# Patient Record
Sex: Male | Born: 1960 | Race: Black or African American | Hispanic: No | Marital: Single | Smoking: Former smoker
Health system: Southern US, Community
[De-identification: ages and names within clinical notes are randomized; demographics above are authoritative.]

## PROBLEM LIST (undated history)

## (undated) DIAGNOSIS — E785 Hyperlipidemia, unspecified: Secondary | ICD-10-CM

## (undated) HISTORY — PX: JOINT REPLACEMENT: SHX530

## (undated) HISTORY — PX: MANDIBLE FRACTURE SURGERY: SHX706

---

## 1998-04-10 ENCOUNTER — Emergency Department (HOSPITAL_COMMUNITY): Admission: EM | Admit: 1998-04-10 | Discharge: 1998-04-10 | Payer: Self-pay | Admitting: Emergency Medicine

## 2005-10-16 ENCOUNTER — Inpatient Hospital Stay (HOSPITAL_COMMUNITY): Admission: EM | Admit: 2005-10-16 | Discharge: 2005-10-22 | Payer: Self-pay | Admitting: Emergency Medicine

## 2005-10-16 ENCOUNTER — Encounter (INDEPENDENT_AMBULATORY_CARE_PROVIDER_SITE_OTHER): Payer: Self-pay | Admitting: *Deleted

## 2005-10-17 ENCOUNTER — Ambulatory Visit: Payer: Self-pay | Admitting: Internal Medicine

## 2007-03-12 ENCOUNTER — Emergency Department (HOSPITAL_COMMUNITY): Admission: EM | Admit: 2007-03-12 | Discharge: 2007-03-12 | Payer: Self-pay | Admitting: Emergency Medicine

## 2007-09-03 ENCOUNTER — Emergency Department (HOSPITAL_COMMUNITY): Admission: EM | Admit: 2007-09-03 | Discharge: 2007-09-03 | Payer: Self-pay | Admitting: Family Medicine

## 2008-05-05 ENCOUNTER — Emergency Department (HOSPITAL_COMMUNITY): Admission: EM | Admit: 2008-05-05 | Discharge: 2008-05-05 | Payer: Self-pay | Admitting: Family Medicine

## 2008-11-02 ENCOUNTER — Emergency Department (HOSPITAL_COMMUNITY): Admission: EM | Admit: 2008-11-02 | Discharge: 2008-11-02 | Payer: Self-pay | Admitting: Family Medicine

## 2008-12-12 ENCOUNTER — Emergency Department (HOSPITAL_COMMUNITY): Admission: EM | Admit: 2008-12-12 | Discharge: 2008-12-12 | Payer: Self-pay | Admitting: Family Medicine

## 2009-01-01 ENCOUNTER — Emergency Department (HOSPITAL_COMMUNITY): Admission: EM | Admit: 2009-01-01 | Discharge: 2009-01-01 | Payer: Self-pay | Admitting: Family Medicine

## 2010-09-24 LAB — POCT URINALYSIS DIP (DEVICE)
Hgb urine dipstick: NEGATIVE
Nitrite: NEGATIVE
Protein, ur: NEGATIVE mg/dL
Specific Gravity, Urine: 1.025 (ref 1.005–1.030)
pH: 7 (ref 5.0–8.0)

## 2010-11-01 NOTE — Op Note (Signed)
[name], Henson                ACCOUNT NO.:  [id number]   MEDICAL RECORD NO.:  [id number]          PATIENT TYPE:  INP   LOCATION:  5727                         FACILITY:  [facility]   PHYSICIAN:  Martina Sinner, MD DATE OF BIRTH:  [birth year]   DATE OF PROCEDURE:  10/16/2005  DATE OF DISCHARGE:                                 OPERATIVE REPORT   PREOPERATIVE DIAGNOSIS:  Perineal and scrotal abscess.   POSTOPERATIVE DIAGNOSIS:  Perineal and scrotal abscess, early Fournier's  gangrene.   SURGEON:  Drainage of perineal abscess and debridement of necrotic  subcutaneous tissue and perineal packing; cystoscopy.   Adam Henson, in the last two days, noticed progressive swelling in the  perineum and up into the scrotum.  He had a leg abscess years ago on the  left lower leg.  He did not have any bowel or urinary symptoms.  He had a  little bit of chills yesterday but it was not toxic.   Patient was prepped and draped in the usual fashion.  He was given IV Ancef  and ciprofloxacin prior to the procedure.  During the operation, we repeated  his Flagyl 500 mg.   On physical examination he had a lot of midline perineal swelling as well as  the lower third of the scrotum.  There was no crepitus.  The skin was not  necrotic.  I made a long perineal incision extending it one third into the  lower scrotum.  The soft tissues were very edematous.  There was minimal to  no pus at first.  I broke through soft tissue into the ischial rectal fossa  and there was a little bit of pus.  On three occasions, I took anaerobic and  aerobic cultures.  Using cautery and blunt dissection, I freed up and opened  up the ischial rectal fossa bilaterally and soft tissue high into the  scrotum but I did not open up the tunica vaginalis.  At first there was not  obvious necrotic subcutaneous tissue.  There was a lot of thickening, as  thick as three quarters of an inch of the subcutaneous tissue under each  flap of  skin bilaterally.  I opened this up with cautery and there was no  question there was necrosis.  Using Allis clamp and Bovie, I removed  necrotic tissue circumferentially throughout the entire incision 360  degrees.  All the necrotic soft tissue was removed leaving approximately a  centimeter of vascularized skin and subcutaneous circumferentially.  The  infection and/or inflammation did not dissect as far to the inguinal crease.  He had a lot of swelling in the midline on top or involving the  bulbospongiosus muscle.  I bivalved this in two areas and did not get any  pus.  There was no necrosis.  When I was removing some necrotic tissue at  around 9 o'clock, I inadvertently made a 3 cm incision dimpling the skin.  Later on, I excised some skin and closed the incision with interrupted 3-0  chromic suture.  At a few locations, there was approximately 0.5 cm of  subcutaneous tissue underneath the well-vascularized skin.  I do not think  he will have any skin necrosis postoperatively.  My goal is to remove as  much palpable and visualized edematous necrotic tissue and leave him with  supple, pliable vascularized tissue.  This was accomplished.   Use the Pulse-Evac, approximately 5 liters of fluid was utilized.  I then  used 500 mL of fluid with bacitracin and polymyxin antibiotics.   Hemostasis was obtained easy with cautery throughout the case.  Total blood  loss was approximately 150 mL.   I did cystoscope the patient early on to make certain he did not have  urethral stricture.  The penile bulbar membranous and prostatic urethra were  normal.  The bladder mucosa was normal.   I did a digital rectal examination.  There was no pus or blood in the  rectum.  With my finger in the ischial rectal fossa bilaterally, I could  feel the rectal wall and I could not feel any induration or abscess.  I did  not feel that a general surgery consultation was required.   I was very pleased with the  removal of any early necrotic tissue.  In my  opinion, this represented an early Fournier's gangrene.  The skin definitely  was not necrotic and looked healthy.  A large wet gauze pack was inserted  followed by a dry dressing with abdominal pad and mesh pants.   The patient will be put on gentamicin and Zosyn postoperatively as per  pharmacy.  I spoke to pharmacy and they know that his serum creatinine was  1.4.  I will repeat his basic metabolic panel and CBC in the morning.           ______________________________  Martina Sinner, MD  Electronically Signed     SAM/MEDQ  D:  10/16/2005  T:  10/17/2005  Job:  [id number]

## 2010-11-01 NOTE — Discharge Summary (Signed)
Adam Henson, Adam Henson                ACCOUNT NO.:  [id number]   MEDICAL RECORD NO.:  [id number]          PATIENT TYPE:  INP   LOCATION:  5742                         FACILITY:  [facility]   PHYSICIAN:  Martina Sinner, MD DATE OF BIRTH:  May 29, 1961   DATE OF ADMISSION:  10/16/2005  DATE OF DISCHARGE:  10/21/2005                                 DISCHARGE SUMMARY   ADMISSION DIAGNOSIS:  Perineal abscess with possible early Fournier's  gangrene.   DISCHARGE DIAGNOSIS:  Community-acquired Staph aureus perineal infection.   SURGERY:  1.  Drainage and debridement of perineal abscess and necrotic tissue.  2.  Cystoscopy.   HOSPITAL COURSE:  Adam Henson was admitted with a 2-day swelling in the  perineum and scrotum.  The surgical note is dictated.  He had debridement  similar to treatment of a Fournier's gangrene.  His cultures came back as  methicillin-resistant Staph aureus.  Infectious Disease saw the patient and  switched his antibiotics to vancomycin.  Initially, he was on Zosyn,  gentamicin and vancomycin.   Adam Henson initially had an elevated white blood count, which normalized on  antibiotics.  He was not febrile.  He did not become toxic.   In the hospital, he had wet-to-dry dressings once a day, which was switched  to twice a day over the weekend.  His wound was granulating in very well.  A  lot of his scrotal cellulitis was normalizing.   DISCHARGE MEDICATIONS:  1.  He was discharged home after a week of IV antibiotics on oral Septra for      3 weeks, one tablet twice a day.  2.  He was given Bactrim DS (double-strength); this was recommended by Dr.      Orvan Falconer in Infectious Disease.   FOLLOWUP:  Adam Henson will follow up in my clinic next week for a wound  check.  Our nurses will call and make certain he is doing well.  He is going  to have daily dressing changes by home health.   CONDITION ON DISCHARGE:  He was voiding well, catheter-free, on the day of   discharge.           ______________________________  Martina Sinner, MD  Electronically Signed     SAM/MEDQ  D:  10/22/2005  T:  10/23/2005  Job:  [id number]

## 2010-11-01 NOTE — H&P (Signed)
Adam Henson, Adam Henson                ACCOUNT NO.:  [id number]   MEDICAL RECORD NO.:  [id number]          PATIENT TYPE:  INP   LOCATION:  5727                         FACILITY:  [facility]   PHYSICIAN:  Martina Sinner, MD DATE OF BIRTH:  [birth year]   DATE OF ADMISSION:  10/16/2005  DATE OF DISCHARGE:                                HISTORY & PHYSICAL   ADMISSION DIAGNOSIS:  Perineal and scrotal abscess with early Fournier's  gangrene (admission postoperatively).   Mr. [name] has a 48-hour history of increasing perineal and scrotal  swelling.  He had no urinary symptoms.  He had no rectal symptoms.  He had  this skin abscess years ago on his left leg drained.  He had no trauma to  the area.  He is not a diabetic.  He had a few chills yesterday but actually  felt quite well when I saw him.  He was having perineal pain.   PAST MEDICAL HISTORY:  No previous surgery.  Negative past medical history.   MEDICATIONS:  None.   ALLERGIES:  None.   SOCIAL HISTORY:  Lives in [place].   FAMILY HISTORY:  No history of prostate cancer.   REVIEW OF SYSTEMS:  The rest of the review of systems is negative.   PHYSICAL EXAMINATION:  VITALS:  Temperature 99.1, blood pressure 120/64.  GENERAL APPEARANCE:  No distress.  SKIN:  Well-healed skin ulcer, left lower leg.  ABDOMEN:  No masses.  RESPIRATORY:  Breathing quietly.  CARDIOVASCULAR:  Extremities warm.  NEUROVASCULAR:  Normal sensation.  MUSCULOSKELETAL:  Normal leg strength.  LYMPHATICS:  No palpable inguinal nodes.  GU:  Perineal swelling.  Bilateral scrotal swelling.  No crepitus or  cellulitis.  No obvious pus or lymph nodes.  Penile shaft normal.  Perirectal area normal.   LABS:  Hemoglobin 13.9, white blood count 17.9.  Urinalysis negative except  for rare bacteria.  Glucose 120, creatinine 1.4.   Mr. Bessey underwent surgery, in which case I opened up the perineum and  debrided early subcutaneous necrotic tissue.  There was a  minimal amount of  pus found and drained.  Copious irrigation was utilized.  The surgical  report has been dictated.  The patient will be admitted for IV antibiotics,  including Zosyn and gentamicin.           ______________________________  Martina Sinner, MD  Electronically Signed     SAM/MEDQ  D:  10/16/2005  T:  10/16/2005  Job:  [id number]

## 2013-03-15 ENCOUNTER — Encounter (HOSPITAL_COMMUNITY): Payer: Self-pay | Admitting: Emergency Medicine

## 2013-03-15 ENCOUNTER — Emergency Department (HOSPITAL_COMMUNITY)
Admission: EM | Admit: 2013-03-15 | Discharge: 2013-03-15 | Disposition: A | Discharge status: Home / Self Care | Payer: BC Managed Care – PPO | Source: Home / Self Care | Attending: Emergency Medicine | Admitting: Emergency Medicine

## 2013-03-15 DIAGNOSIS — S39012A Strain of muscle, fascia and tendon of lower back, initial encounter: Secondary | ICD-10-CM

## 2013-03-15 DIAGNOSIS — S335XXA Sprain of ligaments of lumbar spine, initial encounter: Secondary | ICD-10-CM

## 2013-03-15 MED ORDER — TRAMADOL HCL 50 MG PO TABS: 100.0000 mg | ORAL_TABLET | Freq: Three times a day (TID) | ORAL | Status: DC | PRN

## 2013-03-15 MED ORDER — MELOXICAM 15 MG PO TABS: 15.0000 mg | ORAL_TABLET | Freq: Every day | ORAL | Status: DC

## 2013-03-15 MED ORDER — METHOCARBAMOL 500 MG PO TABS: 500.0000 mg | ORAL_TABLET | Freq: Three times a day (TID) | ORAL | Status: DC

## 2013-03-15 NOTE — ED Notes (Signed)
Pt c/o lower back pain onset 1 week Denies inj/trauma, urinary sxs Reports he has been lifting heavy bags of 55 lbs at work and lifting weights  Taking ibup w/temp relief and applying ice/heat He is alert w/no signs of acute distress.

## 2013-03-16 NOTE — ED Provider Notes (Signed)
Chief Complaint:   Chief Complaint  Patient presents with  . Back Pain    History of Present Illness:   Adam Henson is a 52 year old male who strained his back about a week ago. He was lifting weights and also lifting some heavy things at work. The pain is worse if he moves and better with ibuprofen. It's localized to the lower back without radiation. He denies any numbness, tingling, or weakness in lower extremities. There no bladder or bowel complaints. He denies any abdominal pain, fever, chills, headache, stiff neck, or unintended weight loss. He had a similar back problem about a year ago which went away with conservative treatment.  Review of Systems:  Other than noted above, the patient denies any of the following symptoms: Systemic:  No fever, chills, severe fatigue, or unexplained weight loss. GI:  No abdominal pain, nausea, vomiting, diarrhea, constipation, incontinence of bowel, or blood in stool. GU:  No dysuria, frequency, urgency, or hematuria. No incontinence of urine or difficulty urinating.  M-S:  No neck pain, joint pain, arthritis, or myalgias. Neuro:  No paresthesias, saddle anesthesia, muscular weakness, or progressive neurological deficit.  PMFSH:  Past medical history, family history, social history, meds, and allergies were reviewed. Specifically, there is no history of cancer, major trauma, osteoporosis, immunosuppression, or HIV infection.  Physical Exam:   Vital signs:  BP 146/90  Pulse 58  Temp(Src) 98.9 F (37.2 C) (Oral)  Resp 18  SpO2 99% General:  Alert, oriented, in no distress. Abdomen:  Soft, non-tender.  No organomegaly or mass.  No pulsatile midline abdominal mass or bruit. Back:  There is tenderness to palpation in the paravertebral muscles in the midline. The back has a limited range of motion with pain. Straight leg raising is negative. Neuro:  Normal muscle strength, sensations and DTRs. Extremities: Pedal pulses were full, there was no  edema. Skin:  Clear, warm and dry.  No rash.  Assessment:  The encounter diagnosis was Lumbar strain, initial encounter.  Plan:   1.  Meds:  The following meds were prescribed:   Discharge Medication List as of 03/15/2013  5:54 PM    START taking these medications   Details  meloxicam (MOBIC) 15 MG tablet Take 1 tablet (15 mg total) by mouth daily., Starting 03/15/2013, Until Discontinued, Normal    methocarbamol (ROBAXIN) 500 MG tablet Take 1 tablet (500 mg total) by mouth 3 (three) times daily., Starting 03/15/2013, Until Discontinued, Normal    traMADol (ULTRAM) 50 MG tablet Take 2 tablets (100 mg total) by mouth every 8 (eight) hours as needed for pain., Starting 03/15/2013, Until Discontinued, Normal        2.  Patient Education/Counseling:  The patient was given appropriate handouts, self care instructions, and instructed in symptomatic relief. The patient was encouraged to try to be as active as possible and given some exercises to do followed by moist heat.  3.  Follow up:  The patient was told to follow up if no better in 3 to 4 days, if becoming worse in any way, and given some red flag symptoms such as worsening pain or new neurological symptoms which would prompt immediate return.  Follow up here if needed.     Reuben Likes, MD 03/16/13 [phone]

## 2013-09-07 ENCOUNTER — Ambulatory Visit (INDEPENDENT_AMBULATORY_CARE_PROVIDER_SITE_OTHER): Payer: Self-pay

## 2013-09-07 ENCOUNTER — Other Ambulatory Visit: Payer: Self-pay | Admitting: Emergency Medicine

## 2013-09-07 DIAGNOSIS — Z0000 Encounter for general adult medical examination without abnormal findings: Secondary | ICD-10-CM

## 2013-09-07 LAB — HEPATIC FUNCTION PANEL
ALT: 17 U/L (ref 10–40)
AST: 22 U/L (ref 14–40)
Alkaline Phosphatase: 58 U/L (ref 25–125)

## 2013-09-07 LAB — LIPID PANEL
Cholesterol: 228 mg/dL — AB (ref 0–200)
HDL: 46 mg/dL (ref 35–70)
LDL CALC: 169 mg/dL
TRIGLYCERIDES: 65 mg/dL (ref 40–160)

## 2013-09-07 LAB — PSA: PSA: 0.5

## 2013-09-07 LAB — CBC AND DIFFERENTIAL
HEMATOCRIT: 48 % (ref 41–53)
Hemoglobin: 15.2 g/dL (ref 13.5–17.5)

## 2013-09-07 LAB — BASIC METABOLIC PANEL
Creatinine: 1.5 mg/dL — AB (ref 0.6–1.3)
GLUCOSE: 90 mg/dL

## 2013-09-23 ENCOUNTER — Ambulatory Visit (INDEPENDENT_AMBULATORY_CARE_PROVIDER_SITE_OTHER): Payer: BC Managed Care – PPO | Admitting: Family Medicine

## 2013-09-23 ENCOUNTER — Encounter: Payer: Self-pay | Admitting: Family Medicine

## 2013-09-23 VITALS — BP 134/81 | HR 77 | Ht 71.0 in | Wt 237.0 lb

## 2013-09-23 DIAGNOSIS — E785 Hyperlipidemia, unspecified: Secondary | ICD-10-CM

## 2013-09-23 DIAGNOSIS — Z131 Encounter for screening for diabetes mellitus: Secondary | ICD-10-CM

## 2013-09-23 DIAGNOSIS — Z283 Underimmunization status: Secondary | ICD-10-CM

## 2013-09-23 DIAGNOSIS — IMO0001 Reserved for inherently not codable concepts without codable children: Secondary | ICD-10-CM | POA: Insufficient documentation

## 2013-09-23 DIAGNOSIS — Z23 Encounter for immunization: Secondary | ICD-10-CM

## 2013-09-23 DIAGNOSIS — R030 Elevated blood-pressure reading, without diagnosis of hypertension: Secondary | ICD-10-CM

## 2013-09-23 DIAGNOSIS — Z2839 Other underimmunization status: Secondary | ICD-10-CM

## 2013-09-23 NOTE — Patient Instructions (Signed)
DASH Diet  The DASH diet stands for "Dietary Approaches to Stop Hypertension." It is a healthy eating plan that has been shown to reduce high blood pressure (hypertension) in as little as 14 days, while also possibly providing other significant health benefits. These other health benefits include reducing the risk of breast cancer after menopause and reducing the risk of type 2 diabetes, heart disease, colon cancer, and stroke. Health benefits also include weight loss and slowing kidney failure in patients with chronic kidney disease.   DIET GUIDELINES  · Limit salt (sodium). Your diet should contain less than 1500 mg of sodium daily.  · Limit refined or processed carbohydrates. Your diet should include mostly whole grains. Desserts and added sugars should be used sparingly.  · Include small amounts of heart-healthy fats. These types of fats include nuts, oils, and tub margarine. Limit saturated and trans fats. These fats have been shown to be harmful in the body.  CHOOSING FOODS   The following food groups are based on a 2000 calorie diet. See your Registered Dietitian for individual calorie needs.  Grains and Grain Products (6 to 8 servings daily)  · Eat More Often: Whole-wheat bread, brown rice, whole-grain or wheat pasta, quinoa, popcorn without added fat or salt (air popped).  · Eat Less Often: White bread, white pasta, white rice, cornbread.  Vegetables (4 to 5 servings daily)  · Eat More Often: Fresh, frozen, and canned vegetables. Vegetables may be raw, steamed, roasted, or grilled with a minimal amount of fat.  · Eat Less Often/Avoid: Creamed or fried vegetables. Vegetables in a cheese sauce.  Fruit (4 to 5 servings daily)  · Eat More Often: All fresh, canned (in natural juice), or frozen fruits. Dried fruits without added sugar. One hundred percent fruit juice (½ cup [237 mL] daily).  · Eat Less Often: Dried fruits with added sugar. Canned fruit in light or heavy syrup.  Lean Meats, Fish, and Poultry (2  servings or less daily. One serving is 3 to 4 oz [85-114 g]).  · Eat More Often: Ninety percent or leaner ground beef, tenderloin, sirloin. Round cuts of beef, chicken breast, turkey breast. All fish. Grill, bake, or broil your meat. Nothing should be fried.  · Eat Less Often/Avoid: Fatty cuts of meat, turkey, or chicken leg, thigh, or wing. Fried cuts of meat or fish.  Dairy (2 to 3 servings)  · Eat More Often: Low-fat or fat-free milk, low-fat plain or light yogurt, reduced-fat or part-skim cheese.  · Eat Less Often/Avoid: Milk (whole, 2%). Whole milk yogurt. Full-fat cheeses.  Nuts, Seeds, and Legumes (4 to 5 servings per week)  · Eat More Often: All without added salt.  · Eat Less Often/Avoid: Salted nuts and seeds, canned beans with added salt.  Fats and Sweets (limited)  · Eat More Often: Vegetable oils, tub margarines without trans fats, sugar-free gelatin. Mayonnaise and salad dressings.  · Eat Less Often/Avoid: Coconut oils, palm oils, butter, stick margarine, cream, half and half, cookies, candy, pie.  FOR MORE INFORMATION  The Dash Diet Eating Plan: www.dashdiet.org  Document Released: 05/22/2011 Document Revised: 08/25/2011 Document Reviewed: 05/22/2011  [employer]® Patient Information ©2014 [employer].

## 2013-09-23 NOTE — Progress Notes (Signed)
CC: Adam Henson is a 53 y.o. male is here for Establish Care   Subjective: HPI:  Pleasant 53 year old here to establish care  He was sent here after cholesterol is noticed to be elevated when he was getting a complete physical exam for his employer. Review of his labs show HDL cholesterol within normal limits, LDL cholesterol 169, triglycerides normal. He tries to stay active but no formal exercise routine. He admits to frequently eating fast foods and does not watch his diet.  He denies any history of heart disease, vascular disease, nor diabetes. No interventions as of yet for lowering cholesterol. Has never been on cholesterol lowering medications  Review of blood work shows that fasting blood sugar was less than 100.  He also had a PSA drawn that was 0.5.   Review of Systems - General ROS: negative for - chills, fever, night sweats, weight gain or weight loss Ophthalmic ROS: negative for - decreased vision Psychological ROS: negative for - anxiety or depression ENT ROS: negative for - hearing change, nasal congestion, tinnitus or allergies Hematological and Lymphatic ROS: negative for - bleeding problems, bruising or swollen lymph nodes Breast ROS: negative Respiratory ROS: no cough, shortness of breath, or wheezing Cardiovascular ROS: no chest pain or dyspnea on exertion Gastrointestinal ROS: no abdominal pain, change in bowel habits, or black or bloody stools Genito-Urinary ROS: negative for - genital discharge, genital ulcers, incontinence or abnormal bleeding from genitals Musculoskeletal ROS: negative for - joint pain or muscle pain Neurological ROS: negative for - headaches or memory loss Dermatological ROS: negative for lumps, mole changes, rash and skin lesion changes  History reviewed. No pertinent past medical history.  History reviewed. No pertinent past surgical history. Family History  Problem Relation Age of Onset  . Lung cancer Father     deceased  . Breast  cancer Mother     History   Social History  . Marital Status: Single    Spouse Name: N/A    Number of Children: N/A  . Years of Education: N/A   Occupational History  . Not on file.   Social History Main Topics  . Smoking status: Former Smoker    Quit date: 09/24/1987  . Smokeless tobacco: Not on file  . Alcohol Use: No  . Drug Use: No  . Sexual Activity: Yes    Partners: Male   Other Topics Concern  . Not on file   Social History Narrative  . No narrative on file     Objective: BP 134/81  Pulse 77  Ht 5\' 11"  (1.803 m)  Wt 237 lb (107.502 kg)  BMI 33.07 kg/m2  General: Alert and Oriented, No Acute Distress HEENT: Pupils equal, round, reactive to light. Conjunctivae clear.   Lungs: Clear to auscultation bilaterally, no wheezing/ronchi/rales.  Comfortable work of breathing. Good air movement. Cardiac: Regular rate and rhythm. Normal S1/S2.  No murmurs, rubs, nor gallops.   Extremities: No peripheral edema.  Strong peripheral pulses.  Mental Status: No depression, anxiety, nor agitation. Skin: Warm and dry.  Assessment & Plan: Nhat was seen today for establish care.  Diagnoses and associated orders for this visit:  Diabetes mellitus screening  Elevated blood pressure  Hyperlipidemia  Immunization deficiency - Tdap vaccine greater than or equal to 7yo IM    Diabetes screening done outside our office is normal Hyperlipidemia: Controlled we discussed diet and exercise interventions that he is optimistic about being able to adhere to, physical activity most days of the week  along with following a DASH diet followup in 3 months for repeat cholesterol Elevated blood pressure: Blood pressure here and in occupational health was in pre- hypertensive range, stressed the importance of limiting sodium and benefits of exercise He believes it has been well he on 10 years since tetanus booster will receive booster today  Return in about 3 months (around  12/23/2013).

## 2013-09-26 ENCOUNTER — Encounter: Payer: Self-pay | Admitting: *Deleted

## 2013-09-26 ENCOUNTER — Ambulatory Visit (INDEPENDENT_AMBULATORY_CARE_PROVIDER_SITE_OTHER): Payer: BC Managed Care – PPO | Admitting: Family Medicine

## 2013-09-26 VITALS — BP 142/86 | HR 60 | Temp 98.0°F | Resp 18 | Ht 71.0 in | Wt 232.0 lb

## 2013-09-26 DIAGNOSIS — R21 Rash and other nonspecific skin eruption: Secondary | ICD-10-CM

## 2013-09-26 MED ORDER — PREDNISONE 20 MG PO TABS: ORAL_TABLET | ORAL | Status: DC

## 2013-09-26 NOTE — Progress Notes (Signed)
[facility] [address], [place] [id number] [phone]  Date:  09/26/2013   Name:  Adam MylarRodner C Henson   DOB:  1961-05-18   MRN:  [id number]  PCP:  Laren BoomHommel, Sean, DO    Chief Complaint: Allergic Reaction   History of Present Illness:  Adam Henson is a 53 y.o. very pleasant male patient who presents with the following:  Here today with a possible allergy which has resulted in a rash on his face.   He is wearing a dust mask at work- he thinks he has been using the same kind for many years.  He does not think it is a different sort or brand,  but it seems to smell a little different to him.  He mask has a rubber seal around the face.    He has noted an itchy break out on his face for about one week.  It was actually worse than it is now- he scrubbed "the scabs" off and then used some gold bond lotion which seemed to help some He does not have any hives, systemic itching, throat or tongue swelling, SOB or wheezing.    Patient Active Problem List   Diagnosis Date Noted  . Hyperlipidemia 09/23/2013  . Elevated blood pressure 09/23/2013    History reviewed. No pertinent past medical history.  Past Surgical History  Procedure Laterality Date  . Mandible fracture surgery    . Joint replacement      History  Substance Use Topics  . Smoking status: Former Smoker    Quit date: 09/24/1987  . Smokeless tobacco: Not on file  . Alcohol Use: No    Family History  Problem Relation Age of Onset  . Lung cancer Father     deceased  . Cancer Father   . Breast cancer Mother   . Cancer Mother     No Known Allergies  Medication list has been reviewed and updated.  Current Outpatient Prescriptions on File Prior to Visit  Medication Sig Dispense Refill  . Multiple Vitamins-Minerals (CENTRUM SILVER PO) Take by mouth.       No current facility-administered medications on file prior to visit.    Review of Systems:  As per HPI- otherwise  negative.   Physical Examination: Filed Vitals:   09/26/13 1323  BP: 142/86  Pulse: 60  Temp: 98 F (36.7 C)  Resp: 18   Filed Vitals:   09/26/13 1323  Height: 5\' 11"  (1.803 m)  Weight: 232 lb (105.235 kg)   Body mass index is 32.37 kg/(m^2). Ideal Body Weight: Weight in (lb) to have BMI = 25: 178.9  GEN: WDWN, NAD, Non-toxic, A & O x 3, large build, looks well HEENT: Atraumatic, Normocephalic. Neck supple. No masses, No LAD.  Bilateral TM wnl, oropharynx normal.  PEERL,EOMI.   He has an irritated mild rash in both nasolabial folds.  It is slightly red, no hives or vesicles.  No angioedema Ears and Nose: No external deformity. CV: RRR, No M/G/R. No JVD. No thrill. No extra heart sounds. PULM: CTA B, no wheezes, crackles, rhonchi. No retractions. No resp. distress. No accessory muscle use. EXTR: No c/c/e NEURO Normal gait.  PSYCH: Normally interactive. Conversant. Not depressed or anxious appearing.  Calm demeanor.    Assessment and Plan: Rash and nonspecific skin eruption - Plan: predniSONE (DELTASONE) 20 MG tablet  Skin irritation.  Will treat with systemic steroids for a short period as prefer to avoid topical steroids on his  face.  Suggested that he avoid using the rubber lined masks in the future as he may be allergic to these.  If any sign of a systemic allergic reaction he should take benadryl and call 911  Signed Abbe Amsterdam, MD

## 2013-09-26 NOTE — Patient Instructions (Signed)
Use the prednisone as directed for your rash and itching. Let me know if you are not better in the next few days- Sooner if worse.   Stay away from the rubber lined dust mask

## 2013-12-23 ENCOUNTER — Ambulatory Visit: Payer: BC Managed Care – PPO | Admitting: Family Medicine

## 2014-01-20 ENCOUNTER — Ambulatory Visit: Payer: BC Managed Care – PPO | Admitting: Family Medicine

## 2014-01-20 DIAGNOSIS — Z0289 Encounter for other administrative examinations: Secondary | ICD-10-CM

## 2016-04-15 ENCOUNTER — Other Ambulatory Visit: Payer: Self-pay | Admitting: Occupational Medicine

## 2016-04-15 ENCOUNTER — Ambulatory Visit: Payer: Self-pay

## 2016-04-15 DIAGNOSIS — Z0000 Encounter for general adult medical examination without abnormal findings: Secondary | ICD-10-CM

## 2016-12-20 ENCOUNTER — Encounter (HOSPITAL_COMMUNITY): Payer: Self-pay | Admitting: Emergency Medicine

## 2016-12-20 ENCOUNTER — Ambulatory Visit (HOSPITAL_COMMUNITY)
Admission: EM | Admit: 2016-12-20 | Discharge: 2016-12-20 | Disposition: A | Discharge status: Home / Self Care | Payer: BLUE CROSS/BLUE SHIELD | Attending: Family Medicine | Admitting: Family Medicine

## 2016-12-20 DIAGNOSIS — L03116 Cellulitis of left lower limb: Secondary | ICD-10-CM

## 2016-12-20 MED ORDER — SULFAMETHOXAZOLE-TRIMETHOPRIM 800-160 MG PO TABS: 1.0000 | ORAL_TABLET | Freq: Two times a day (BID) | ORAL | 0 refills | Status: AC

## 2016-12-20 NOTE — Discharge Instructions (Signed)
Today you were diagnosed with the following: 1. Cellulitis of left upper extremity    You have been prescribed prescription medications this visit.   Meds ordered this encounter  Medications   sulfamethoxazole-trimethoprim (BACTRIM DS,SEPTRA DS) 800-160 MG tablet    Sig: Take 1 tablet by mouth 2 (two) times daily.    Dispense:  20 tablet    Refill:  0   Please take all medications as directed. If not improving within the next 48-72 hours please return for an evaluation.  Your blood pressure was noted to be elevated during your visit today. You may return here within the next few days or when you are well to recheck if unable to see your primary care doctor.

## 2016-12-20 NOTE — ED Triage Notes (Signed)
States abscess is located at left upper thigh.  Patient has a history of the same

## 2016-12-20 NOTE — ED Provider Notes (Signed)
  [facility] CARE CENTER   [id number] 12/20/16 Arrival Time: 1553  ASSESSMENT & PLAN:  Today you were diagnosed with the following: 1. Cellulitis of left lower extremity    You have been prescribed prescription medications this visit.   Meds ordered this encounter  Medications  . sulfamethoxazole-trimethoprim (BACTRIM DS,SEPTRA DS) 800-160 MG tablet    Sig: Take 1 tablet by mouth 2 (two) times daily.    Dispense:  20 tablet    Refill:  0   Please take all medications as directed. If not improving within the next 48-72 hours please return for an evaluation.  Your blood pressure was noted to be elevated during your visit today. You may return here within the next few days or when you are well to recheck if unable to see your primary care doctor.   Reviewed expectations re: course of current medical issues. Questions answered. Outlined signs and symptoms indicating need for more acute intervention. Patient verbalized understanding. After Visit Summary given.   SUBJECTIVE:  Adam Henson is a 56 y.o. male who presents with complaint of skin infection on LL thigh. Several days. Increasing erythema. Tenderness. Afebrile. No drainage. No self treatment. Distant h/o similar. Ambulatory.  ROS: As per HPI.   OBJECTIVE:  Vitals:   12/20/16 1637  BP: 140/90  Pulse: 68  Temp: 98.5 F (36.9 C)  TempSrc: Oral  SpO2: 100%     General appearance: alert; no distress Extremities: left upper anterior thigh with 3cm area of erythema and thickened skin; some tenderness; no fluctuance MSK: symmetrical with no gross deformities   No Known Allergies  PMHx, SurgHx, SocialHx, Medications, and Allergies were reviewed in the Visit Navigator and updated as appropriate.      Mardella Layman, MD 12/21/16 [phone]

## 2018-07-14 ENCOUNTER — Ambulatory Visit: Payer: Self-pay | Admitting: Physician Assistant

## 2020-12-17 ENCOUNTER — Other Ambulatory Visit: Payer: Self-pay

## 2020-12-17 ENCOUNTER — Encounter (HOSPITAL_COMMUNITY): Payer: Self-pay | Admitting: *Deleted

## 2020-12-17 ENCOUNTER — Ambulatory Visit (HOSPITAL_COMMUNITY)
Admission: EM | Admit: 2020-12-17 | Discharge: 2020-12-17 | Disposition: A | Discharge status: Home / Self Care | Payer: BC Managed Care – PPO | Attending: Urgent Care | Admitting: Urgent Care

## 2020-12-17 DIAGNOSIS — M546 Pain in thoracic spine: Secondary | ICD-10-CM | POA: Diagnosis not present

## 2020-12-17 DIAGNOSIS — M62830 Muscle spasm of back: Secondary | ICD-10-CM | POA: Diagnosis not present

## 2020-12-17 DIAGNOSIS — S29012A Strain of muscle and tendon of back wall of thorax, initial encounter: Secondary | ICD-10-CM

## 2020-12-17 LAB — POCT URINALYSIS DIPSTICK, ED / UC
Bilirubin Urine: NEGATIVE
Glucose, UA: NEGATIVE mg/dL
Ketones, ur: NEGATIVE mg/dL
Leukocytes,Ua: NEGATIVE
Nitrite: NEGATIVE
Protein, ur: NEGATIVE mg/dL
Specific Gravity, Urine: 1.025 (ref 1.005–1.030)
Urobilinogen, UA: 1 mg/dL (ref 0.0–1.0)
pH: 6 (ref 5.0–8.0)

## 2020-12-17 MED ORDER — TIZANIDINE HCL 4 MG PO TABS: 4.0000 mg | ORAL_TABLET | Freq: Three times a day (TID) | ORAL | 0 refills | Status: DC | PRN

## 2020-12-17 MED ORDER — NAPROXEN 500 MG PO TABS: 500.0000 mg | ORAL_TABLET | Freq: Two times a day (BID) | ORAL | 0 refills | Status: DC

## 2020-12-17 NOTE — ED Provider Notes (Signed)
[facility] - URGENT CARE CENTER   MRN: [id number] DOB: 1961/06/13  Subjective:   Adam Henson is a 60 y.o. male presenting for 4-day history of acute onset progressively worsening 10 out of 10 thoracic back pain on the right side.  Denies fever, nausea, vomiting, hematuria, dysuria, history of kidney stones, rashes, falls, trauma.  Patient works a very strenuous job, does a lot of fast-paced work, intermittent lifting.  Tries to stay hydrated.  Denies taking chronic medications.  No Known Allergies  No past medical history on file.   Past Surgical History:  Procedure Laterality Date   JOINT REPLACEMENT     MANDIBLE FRACTURE SURGERY      Family History  Problem Relation Age of Onset   Lung cancer Father        deceased   Cancer Father    Breast cancer Mother    Cancer Mother     Social History   Tobacco Use   Smoking status: Former    Pack years: 0.00    Types: Cigarettes    Quit date: 09/24/1987    Years since quitting: 33.2   Smokeless tobacco: Never  Substance Use Topics   Alcohol use: No   Drug use: No    ROS   Objective:   Vitals: BP 130/77   Pulse 63   Temp 98.4 F (36.9 C)   Resp 18   SpO2 100%   Physical Exam Constitutional:      General: He is not in acute distress.    Appearance: Normal appearance. He is well-developed and normal weight. He is not ill-appearing, toxic-appearing or diaphoretic.  HENT:     Head: Normocephalic and atraumatic.     Right Ear: External ear normal.     Left Ear: External ear normal.     Nose: Nose normal.     Mouth/Throat:     Pharynx: Oropharynx is clear.  Eyes:     General: No scleral icterus.       Right eye: No discharge.        Left eye: No discharge.     Extraocular Movements: Extraocular movements intact.     Pupils: Pupils are equal, round, and reactive to light.  Cardiovascular:     Rate and Rhythm: Normal rate.  Pulmonary:     Effort: Pulmonary effort is normal.  Musculoskeletal:      Cervical back: Normal range of motion.     Thoracic back: Spasms and tenderness present. No swelling, edema, deformity, signs of trauma, lacerations or bony tenderness. Normal range of motion. No scoliosis.     Lumbar back: No swelling, edema, deformity, signs of trauma, lacerations, spasms, tenderness or bony tenderness. Normal range of motion. Negative right straight leg raise test and negative left straight leg raise test. No scoliosis.       Back:     Comments: No midline tenderness.  Skin:    Findings: No rash.  Neurological:     Mental Status: He is alert and oriented to person, place, and time.     Motor: No weakness.     Coordination: Coordination normal.     Gait: Gait normal.     Deep Tendon Reflexes: Reflexes normal.  Psychiatric:        Mood and Affect: Mood normal.        Behavior: Behavior normal.        Thought Content: Thought content normal.        Judgment: Judgment normal.  Results for orders placed or performed during the hospital encounter of 12/17/20 (from the past 24 hour(s))  POC Urinalysis dipstick     Status: Abnormal   Collection Time: 12/17/20  9:49 AM  Result Value Ref Range   Glucose, UA NEGATIVE NEGATIVE mg/dL   Bilirubin Urine NEGATIVE NEGATIVE   Ketones, ur NEGATIVE NEGATIVE mg/dL   Specific Gravity, Urine 1.025 1.005 - 1.030   Hgb urine dipstick TRACE (A) NEGATIVE   pH 6.0 5.0 - 8.0   Protein, ur NEGATIVE NEGATIVE mg/dL   Urobilinogen, UA 1.0 0.0 - 1.0 mg/dL   Nitrite NEGATIVE NEGATIVE   Leukocytes,Ua NEGATIVE NEGATIVE    Assessment and Plan :   PDMP not reviewed this encounter.  1. Acute right-sided thoracic back pain   2. Muscle spasm of back   3. Strain of thoracic back region     Will manage conservatively for back strain with NSAID and muscle relaxant, rest and modification of physical activity.  Anticipatory guidance provided.  Counseled patient on potential for adverse effects with medications prescribed/recommended today, ER  and return-to-clinic precautions discussed, patient verbalized understanding.    Wallis Bamberg, PA-C 12/17/20 1010

## 2020-12-17 NOTE — ED Triage Notes (Signed)
Pt reports back pain starting on Thursday.

## 2021-11-28 ENCOUNTER — Encounter (HOSPITAL_COMMUNITY): Payer: Self-pay

## 2021-11-28 ENCOUNTER — Emergency Department (HOSPITAL_COMMUNITY): Payer: BC Managed Care – PPO

## 2021-11-28 ENCOUNTER — Inpatient Hospital Stay (HOSPITAL_COMMUNITY)
Admission: EM | Admit: 2021-11-28 | Discharge: 2021-12-08 | DRG: 286 | Disposition: A | Discharge status: Home / Self Care | Payer: BC Managed Care – PPO | Attending: Family Medicine | Admitting: Family Medicine

## 2021-11-28 ENCOUNTER — Other Ambulatory Visit: Payer: Self-pay

## 2021-11-28 DIAGNOSIS — M7989 Other specified soft tissue disorders: Secondary | ICD-10-CM | POA: Diagnosis not present

## 2021-11-28 DIAGNOSIS — Z87891 Personal history of nicotine dependence: Secondary | ICD-10-CM | POA: Diagnosis not present

## 2021-11-28 DIAGNOSIS — E669 Obesity, unspecified: Secondary | ICD-10-CM | POA: Diagnosis present

## 2021-11-28 DIAGNOSIS — N1831 Chronic kidney disease, stage 3a: Secondary | ICD-10-CM | POA: Diagnosis present

## 2021-11-28 DIAGNOSIS — R7989 Other specified abnormal findings of blood chemistry: Secondary | ICD-10-CM | POA: Diagnosis not present

## 2021-11-28 DIAGNOSIS — Z83438 Family history of other disorder of lipoprotein metabolism and other lipidemia: Secondary | ICD-10-CM

## 2021-11-28 DIAGNOSIS — E785 Hyperlipidemia, unspecified: Secondary | ICD-10-CM | POA: Diagnosis present

## 2021-11-28 DIAGNOSIS — R570 Cardiogenic shock: Secondary | ICD-10-CM | POA: Diagnosis not present

## 2021-11-28 DIAGNOSIS — Z801 Family history of malignant neoplasm of trachea, bronchus and lung: Secondary | ICD-10-CM

## 2021-11-28 DIAGNOSIS — R739 Hyperglycemia, unspecified: Secondary | ICD-10-CM | POA: Diagnosis not present

## 2021-11-28 DIAGNOSIS — Z6831 Body mass index (BMI) 31.0-31.9, adult: Secondary | ICD-10-CM | POA: Diagnosis not present

## 2021-11-28 DIAGNOSIS — K5900 Constipation, unspecified: Secondary | ICD-10-CM | POA: Diagnosis present

## 2021-11-28 DIAGNOSIS — I340 Nonrheumatic mitral (valve) insufficiency: Secondary | ICD-10-CM | POA: Diagnosis not present

## 2021-11-28 DIAGNOSIS — I4891 Unspecified atrial fibrillation: Secondary | ICD-10-CM | POA: Diagnosis present

## 2021-11-28 DIAGNOSIS — I248 Other forms of acute ischemic heart disease: Secondary | ICD-10-CM | POA: Diagnosis present

## 2021-11-28 DIAGNOSIS — R778 Other specified abnormalities of plasma proteins: Secondary | ICD-10-CM | POA: Diagnosis not present

## 2021-11-28 DIAGNOSIS — N1832 Chronic kidney disease, stage 3b: Secondary | ICD-10-CM | POA: Diagnosis present

## 2021-11-28 DIAGNOSIS — N179 Acute kidney failure, unspecified: Secondary | ICD-10-CM | POA: Diagnosis present

## 2021-11-28 DIAGNOSIS — I130 Hypertensive heart and chronic kidney disease with heart failure and stage 1 through stage 4 chronic kidney disease, or unspecified chronic kidney disease: Secondary | ICD-10-CM | POA: Diagnosis present

## 2021-11-28 DIAGNOSIS — I513 Intracardiac thrombosis, not elsewhere classified: Secondary | ICD-10-CM | POA: Diagnosis present

## 2021-11-28 DIAGNOSIS — I5021 Acute systolic (congestive) heart failure: Secondary | ICD-10-CM | POA: Diagnosis present

## 2021-11-28 DIAGNOSIS — Q2112 Patent foramen ovale: Secondary | ICD-10-CM

## 2021-11-28 DIAGNOSIS — Z803 Family history of malignant neoplasm of breast: Secondary | ICD-10-CM | POA: Diagnosis not present

## 2021-11-28 DIAGNOSIS — E871 Hypo-osmolality and hyponatremia: Secondary | ICD-10-CM | POA: Diagnosis not present

## 2021-11-28 DIAGNOSIS — I493 Ventricular premature depolarization: Secondary | ICD-10-CM | POA: Diagnosis not present

## 2021-11-28 DIAGNOSIS — D696 Thrombocytopenia, unspecified: Secondary | ICD-10-CM | POA: Diagnosis not present

## 2021-11-28 DIAGNOSIS — R7401 Elevation of levels of liver transaminase levels: Secondary | ICD-10-CM | POA: Diagnosis not present

## 2021-11-28 DIAGNOSIS — I509 Heart failure, unspecified: Secondary | ICD-10-CM | POA: Diagnosis not present

## 2021-11-28 DIAGNOSIS — I420 Dilated cardiomyopathy: Secondary | ICD-10-CM | POA: Diagnosis present

## 2021-11-28 HISTORY — DX: Hyperlipidemia, unspecified: E78.5

## 2021-11-28 LAB — TROPONIN I (HIGH SENSITIVITY): Troponin I (High Sensitivity): 23 ng/L — ABNORMAL HIGH (ref <18)

## 2021-11-28 LAB — CBC WITH DIFFERENTIAL/PLATELET
Abs Immature Granulocytes: 0.07 10*3/uL (ref 0.00–0.07)
Basophils Absolute: 0.1 10*3/uL (ref 0.0–0.1)
Basophils Relative: 1 %
Eosinophils Absolute: 0.1 10*3/uL (ref 0.0–0.5)
Eosinophils Relative: 1 %
HCT: 48.7 % (ref 39.0–52.0)
Hemoglobin: 16.3 g/dL (ref 13.0–17.0)
Immature Granulocytes: 1 %
Lymphocytes Relative: 25 %
Lymphs Abs: 2.7 10*3/uL (ref 0.7–4.0)
MCH: 30.2 pg (ref 26.0–34.0)
MCHC: 33.5 g/dL (ref 30.0–36.0)
MCV: 90.2 fL (ref 80.0–100.0)
Monocytes Absolute: 1 10*3/uL (ref 0.1–1.0)
Monocytes Relative: 9 %
Neutro Abs: 7 10*3/uL (ref 1.7–7.7)
Neutrophils Relative %: 63 %
Platelets: 206 10*3/uL (ref 150–400)
RBC: 5.4 MIL/uL (ref 4.22–5.81)
RDW: 13.2 % (ref 11.5–15.5)
WBC: 11 10*3/uL — ABNORMAL HIGH (ref 4.0–10.5)
nRBC: 0 % (ref 0.0–0.2)

## 2021-11-28 LAB — COMPREHENSIVE METABOLIC PANEL
ALT: 67 U/L — ABNORMAL HIGH (ref 0–44)
AST: 65 U/L — ABNORMAL HIGH (ref 15–41)
Albumin: 3.6 g/dL (ref 3.5–5.0)
Alkaline Phosphatase: 50 U/L (ref 38–126)
Anion gap: 10 (ref 5–15)
BUN: 27 mg/dL — ABNORMAL HIGH (ref 6–20)
CO2: 25 mmol/L (ref 22–32)
Calcium: 9 mg/dL (ref 8.9–10.3)
Chloride: 105 mmol/L (ref 98–111)
Creatinine, Ser: 1.92 mg/dL — ABNORMAL HIGH (ref 0.61–1.24)
GFR, Estimated: 39 mL/min — ABNORMAL LOW (ref >60)
Glucose, Bld: 100 mg/dL — ABNORMAL HIGH (ref 70–99)
Potassium: 5.1 mmol/L (ref 3.5–5.1)
Sodium: 140 mmol/L (ref 135–145)
Total Bilirubin: 3.2 mg/dL — ABNORMAL HIGH (ref 0.3–1.2)
Total Protein: 6 g/dL — ABNORMAL LOW (ref 6.5–8.1)

## 2021-11-28 LAB — MAGNESIUM: Magnesium: 2.2 mg/dL (ref 1.7–2.4)

## 2021-11-28 LAB — BRAIN NATRIURETIC PEPTIDE: B Natriuretic Peptide: 639.5 pg/mL — ABNORMAL HIGH (ref 0.0–100.0)

## 2021-11-28 MED ORDER — FUROSEMIDE 10 MG/ML IJ SOLN
40.0000 mg | Freq: Once | INTRAMUSCULAR | Status: AC
  Administered 2021-11-28: 40 mg via INTRAVENOUS
  Filled 2021-11-28: qty 4

## 2021-11-28 MED ORDER — DILTIAZEM LOAD VIA INFUSION
20.0000 mg | Freq: Once | INTRAVENOUS | Status: AC
  Administered 2021-11-28: 20 mg via INTRAVENOUS
  Filled 2021-11-28: qty 20

## 2021-11-28 MED ORDER — DILTIAZEM HCL-DEXTROSE 125-5 MG/125ML-% IV SOLN (PREMIX)
5.0000 mg/h | INTRAVENOUS | Status: DC
  Administered 2021-11-28: 5 mg/h via INTRAVENOUS
  Filled 2021-11-28 (×2): qty 125

## 2021-11-28 NOTE — ED Provider Triage Note (Addendum)
Emergency Medicine Provider Triage Evaluation Note  Jawuan BERTRAND VOWELS , a 61 y.o. male  was evaluated in triage.  Pt complains of abdomen and leg swelling  Review of Systems  Positive:  Negative:   Physical Exam  BP 104/90   Pulse 62   Temp 98.8 F (37.1 C) (Oral)   Resp 18   SpO2 99%  Gen:   Awake, no distress   Resp:  Normal effort  MSK:   Moves extremities without difficulty  Other:    Medical Decision Making  Medically screening exam initiated at 9:17 PM.  Appropriate orders placed.  Nehemiah C Macaraeg was informed that the remainder of the evaluation will be completed by another provider, this initial triage assessment does not replace that evaluation, and the importance of remaining in the ED until their evaluation is complete.  EKG shows afib at 155,    Osie Cheeks 11/28/21 2118    Elson Areas, [place] 11/28/21 2120

## 2021-11-28 NOTE — ED Notes (Signed)
Lab called to add on Mg and BNP

## 2021-11-28 NOTE — ED Triage Notes (Signed)
Pt states that he has been having SOB and swelling in his lower extremities for the past several weeks, pt is in afib RVR with no hx

## 2021-11-28 NOTE — ED Notes (Signed)
PA at bedside.

## 2021-11-28 NOTE — ED Provider Notes (Signed)
[facility] EMERGENCY DEPARTMENT Provider Note   CSN: [id number] Arrival date & time: 11/28/21  2037     History  Chief Complaint  Patient presents with   Shortness of Breath    Adam Henson is a 61 y.o. male with no relevant medical history.  Patient presents ED for evaluation of leg swelling.  Patient reports that for the last 3 weeks he has had bilateral lower pitting edema along with some shortness of breath.  Patient states that typically he was able to elevate his legs which relieved the swelling.  Patient states that he typically works out twice a week and since then has had decreased energy causing him to not work out as frequently.  Patient states that he came in to ED tonight because of leg swelling continued despite elevation.  Patient denies any chest pain, nausea, vomiting, abdominal pain, fevers, lightheadedness, dizziness, weakness, syncope.   Shortness of Breath Associated symptoms: no abdominal pain, no chest pain, no fever and no vomiting        Home Medications Prior to Admission medications   Medication Sig Start Date End Date Taking? Authorizing Provider  Multiple Vitamins-Minerals (CENTRUM SILVER PO) Take by mouth.    [provider]  naproxen (NAPROSYN) 500 MG tablet Take 1 tablet (500 mg total) by mouth 2 (two) times daily with a meal. 12/17/20   Wallis Bamberg, PA-C  predniSONE (DELTASONE) 20 MG tablet Take 2 pills a day for 3 days, then 1 pill a day for 3 days 09/26/13   Copland, Gwenlyn Found, MD  tiZANidine (ZANAFLEX) 4 MG tablet Take 1 tablet (4 mg total) by mouth every 8 (eight) hours as needed. 12/17/20   Wallis Bamberg, PA-C      Allergies    Patient has no known allergies.    Review of Systems   Review of Systems  Constitutional:  Negative for chills and fever.  Respiratory:  Positive for shortness of breath.   Cardiovascular:  Positive for leg swelling. Negative for chest pain.  Gastrointestinal:  Negative for abdominal pain,  nausea and vomiting.  Neurological:  Negative for dizziness, syncope, weakness and light-headedness.  All other systems reviewed and are negative.   Physical Exam Updated Vital Signs BP (!) 130/111   Pulse (!) 129   Temp 98.8 F (37.1 C) (Oral)   Resp (!) 25   SpO2 95%  Physical Exam Vitals and nursing note reviewed.  Constitutional:      General: He is not in acute distress.    Appearance: He is not ill-appearing, toxic-appearing or diaphoretic.  HENT:     Head: Normocephalic and atraumatic.     Nose: Nose normal.     Mouth/Throat:     Mouth: Mucous membranes are moist.     Pharynx: Oropharynx is clear.  Eyes:     Extraocular Movements: Extraocular movements intact.     Pupils: Pupils are equal, round, and reactive to light.  Cardiovascular:     Rate and Rhythm: Normal rate and regular rhythm.  Pulmonary:     Effort: Pulmonary effort is normal.     Breath sounds: Rhonchi present.  Abdominal:     General: Abdomen is flat.     Palpations: Abdomen is soft.     Tenderness: There is no abdominal tenderness.  Musculoskeletal:     Cervical back: Normal range of motion and neck supple.     Right lower leg: 2+ Pitting Edema present.     Left lower leg:  2+ Pitting Edema present.  Skin:    General: Skin is warm and dry.     Capillary Refill: Capillary refill takes less than 2 seconds.  Neurological:     Mental Status: He is alert and oriented to person, place, and time.     ED Results / Procedures / Treatments   Labs (all labs ordered are listed, but only abnormal results are displayed) Labs Reviewed  CBC WITH DIFFERENTIAL/PLATELET - Abnormal; Notable for the following components:      Result Value   WBC 11.0 (*)    All other components within normal limits  COMPREHENSIVE METABOLIC PANEL - Abnormal; Notable for the following components:   Glucose, Bld 100 (*)    BUN 27 (*)    Creatinine, Ser 1.92 (*)    Total Protein 6.0 (*)    AST 65 (*)    ALT 67 (*)    Total  Bilirubin 3.2 (*)    GFR, Estimated 39 (*)    All other components within normal limits  BRAIN NATRIURETIC PEPTIDE - Abnormal; Notable for the following components:   B Natriuretic Peptide 639.5 (*)    All other components within normal limits  TROPONIN I (HIGH SENSITIVITY) - Abnormal; Notable for the following components:   Troponin I (High Sensitivity) 23 (*)    All other components within normal limits  MAGNESIUM  TSH  TROPONIN I (HIGH SENSITIVITY)    EKG EKG Interpretation  Date/Time:  Thursday November 28 2021 21:15:08 EDT Ventricular Rate:  155 PR Interval:    QRS Duration: 82 QT Interval:  300 QTC Calculation: 482 R Axis:   109 Text Interpretation: Atrial fibrillation with rapid ventricular response Rightward axis T wave abnormality, consider inferior ischemia Abnormal ECG No previous ECGs available Confirmed by Rolan Bucco ([id number]) on 11/28/2021 9:57:11 PM  Radiology DG Chest Port 1 View  Result Date: 11/28/2021 CLINICAL DATA:  Shortness of breath. EXAM: PORTABLE CHEST 1 VIEW COMPARISON:  Chest radiograph dated 04/15/2016. FINDINGS: There is slight blunting of the costophrenic angles which may represent atelectasis, or small pleural effusions. No focal consolidation or pneumothorax. Borderline cardiomegaly. No acute osseous pathology. IMPRESSION: Small bilateral pleural effusions versus atelectasis. No focal consolidation. Electronically Signed   By: Elgie Collard M.D.   On: 11/28/2021 22:30    Procedures Procedures   Medications Ordered in ED Medications  diltiazem (CARDIZEM) 1 mg/mL load via infusion 20 mg (20 mg Intravenous Bolus from Bag 11/28/21 2216)    And  diltiazem (CARDIZEM) 125 mg in dextrose 5% 125 mL (1 mg/mL) infusion (10 mg/hr Intravenous Rate/Dose Change 11/28/21 2250)  furosemide (LASIX) injection 40 mg (40 mg Intravenous Given 11/28/21 2214)    ED Course/ Medical Decision Making/ A&P                           Medical Decision Making Amount and/or  Complexity of Data Reviewed Labs: ordered. Radiology: ordered.  Risk Prescription drug management. Decision regarding hospitalization.   61 year old male presents to the ED for evaluation.  Please see HPI for further details.  On examination, the patient is tachycardic with heart rate ranging between 110 and 170 on the monitor.  The patient lung sounds are rhonchorous.  Patient not hypoxic on room air.  Patient abdomen soft and compressible all 4 quadrants.  Patient neurological examination shows no focal neurodeficits.  The patient has 2+ bilateral pitting edema.  Patient presentation concerning for new onset A-fib with RVR.  Patient worked up utilizing the following labs and imaging studies inserted by me personally: - BNP elevated at 639 - Troponin elevated at 23 - CBC shows mild leukocytosis of 11 - CMP shows elevated creatinine 1.92, elevated BUN to 27.  The patient denies any history of chronic kidney disease. - Chest x-ray shows bilateral pleural effusions  Patient treated with Cardizem, 40 mg lasix.   This patient will need to be admitted for new onset A-fib with RVR.  Dr. Myna Hidalgo, hospitalist team, has been consulted for admission.  Dr. Myna Hidalgo has agreed to admit the patient for further management.  Final Clinical Impression(s) / ED Diagnoses Final diagnoses:  Atrial fibrillation with RVR [facility])    Rx / [place] Orders ED Discharge Orders     None         Lawana Chambers 11/28/21 2344    Malvin Johns, MD 11/29/21 [phone]

## 2021-11-29 ENCOUNTER — Inpatient Hospital Stay (HOSPITAL_COMMUNITY): Payer: BC Managed Care – PPO

## 2021-11-29 ENCOUNTER — Encounter (HOSPITAL_COMMUNITY): Payer: Self-pay | Admitting: Family Medicine

## 2021-11-29 DIAGNOSIS — R778 Other specified abnormalities of plasma proteins: Secondary | ICD-10-CM | POA: Insufficient documentation

## 2021-11-29 DIAGNOSIS — R7989 Other specified abnormal findings of blood chemistry: Secondary | ICD-10-CM | POA: Diagnosis present

## 2021-11-29 DIAGNOSIS — N1832 Chronic kidney disease, stage 3b: Secondary | ICD-10-CM | POA: Diagnosis present

## 2021-11-29 DIAGNOSIS — N1831 Chronic kidney disease, stage 3a: Secondary | ICD-10-CM | POA: Diagnosis present

## 2021-11-29 DIAGNOSIS — I4891 Unspecified atrial fibrillation: Secondary | ICD-10-CM

## 2021-11-29 DIAGNOSIS — I509 Heart failure, unspecified: Secondary | ICD-10-CM | POA: Insufficient documentation

## 2021-11-29 LAB — CBC
HCT: 44.1 % (ref 39.0–52.0)
Hemoglobin: 14.8 g/dL (ref 13.0–17.0)
MCH: 29.7 pg (ref 26.0–34.0)
MCHC: 33.6 g/dL (ref 30.0–36.0)
MCV: 88.4 fL (ref 80.0–100.0)
Platelets: 172 10*3/uL (ref 150–400)
RBC: 4.99 MIL/uL (ref 4.22–5.81)
RDW: 13.2 % (ref 11.5–15.5)
WBC: 9.7 10*3/uL (ref 4.0–10.5)
nRBC: 0 % (ref 0.0–0.2)

## 2021-11-29 LAB — TROPONIN I (HIGH SENSITIVITY)
Troponin I (High Sensitivity): 52 ng/L — ABNORMAL HIGH (ref <18)
Troponin I (High Sensitivity): 573 ng/L (ref <18)

## 2021-11-29 LAB — HIV ANTIBODY (ROUTINE TESTING W REFLEX): HIV Screen 4th Generation wRfx: NONREACTIVE

## 2021-11-29 LAB — HEPATITIS PANEL, ACUTE
HCV Ab: NONREACTIVE
Hep A IgM: NONREACTIVE
Hep B C IgM: NONREACTIVE
Hepatitis B Surface Ag: NONREACTIVE

## 2021-11-29 LAB — PROTIME-INR
INR: 1.4 — ABNORMAL HIGH (ref 0.8–1.2)
Prothrombin Time: 17.1 seconds — ABNORMAL HIGH (ref 11.4–15.2)

## 2021-11-29 LAB — ECHOCARDIOGRAM COMPLETE
Calc EF: 37.9 %
MV M vel: 3.78 m/s
MV Peak grad: 57.2 mmHg
S' Lateral: 4.9 cm
Single Plane A2C EF: 39.2 %
Single Plane A4C EF: 33.8 %

## 2021-11-29 LAB — COMPREHENSIVE METABOLIC PANEL
ALT: 60 U/L — ABNORMAL HIGH (ref 0–44)
AST: 51 U/L — ABNORMAL HIGH (ref 15–41)
Albumin: 3.1 g/dL — ABNORMAL LOW (ref 3.5–5.0)
Alkaline Phosphatase: 47 U/L (ref 38–126)
Anion gap: 10 (ref 5–15)
BUN: 27 mg/dL — ABNORMAL HIGH (ref 6–20)
CO2: 26 mmol/L (ref 22–32)
Calcium: 8.7 mg/dL — ABNORMAL LOW (ref 8.9–10.3)
Chloride: 105 mmol/L (ref 98–111)
Creatinine, Ser: 1.86 mg/dL — ABNORMAL HIGH (ref 0.61–1.24)
GFR, Estimated: 41 mL/min — ABNORMAL LOW (ref >60)
Glucose, Bld: 109 mg/dL — ABNORMAL HIGH (ref 70–99)
Potassium: 3.7 mmol/L (ref 3.5–5.1)
Sodium: 141 mmol/L (ref 135–145)
Total Bilirubin: 1.9 mg/dL — ABNORMAL HIGH (ref 0.3–1.2)
Total Protein: 5.4 g/dL — ABNORMAL LOW (ref 6.5–8.1)

## 2021-11-29 LAB — TSH: TSH: 3.081 u[IU]/mL (ref 0.350–4.500)

## 2021-11-29 MED ORDER — ACETAMINOPHEN 325 MG PO TABS: 650.0000 mg | ORAL_TABLET | ORAL | Status: DC | PRN

## 2021-11-29 MED ORDER — METOPROLOL TARTRATE 12.5 MG HALF TABLET
12.5000 mg | ORAL_TABLET | Freq: Two times a day (BID) | ORAL | Status: DC
  Administered 2021-11-29 (×2): 12.5 mg via ORAL
  Filled 2021-11-29 (×2): qty 1

## 2021-11-29 MED ORDER — ONDANSETRON HCL 4 MG/2ML IJ SOLN: 4.0000 mg | Freq: Four times a day (QID) | INTRAMUSCULAR | Status: DC | PRN

## 2021-11-29 MED ORDER — ENOXAPARIN SODIUM 40 MG/0.4ML IJ SOSY
40.0000 mg | PREFILLED_SYRINGE | Freq: Every day | INTRAMUSCULAR | Status: DC
  Administered 2021-11-30: 40 mg via SUBCUTANEOUS
  Filled 2021-11-29 (×3): qty 0.4

## 2021-11-29 MED ORDER — GUAIFENESIN-DM 100-10 MG/5ML PO SYRP
15.0000 mL | ORAL_SOLUTION | ORAL | Status: DC | PRN
  Administered 2021-11-29: 15 mL via ORAL
  Filled 2021-11-29: qty 15

## 2021-11-29 NOTE — ED Notes (Signed)
Dr. Antionette Char returned page.  Made aware of critical lab values.  No new orders received.  Will continue to monitor

## 2021-11-29 NOTE — Progress Notes (Signed)
  Echocardiogram 2D Echocardiogram has been performed.  Adam Henson 11/29/2021, 3:51 PM

## 2021-11-29 NOTE — ED Notes (Signed)
MD paged at this time.  Notified to call RN to discuss critical lab values

## 2021-11-29 NOTE — Progress Notes (Signed)
PROGRESS NOTE    Adam Henson  OFB:[id number] DOB: 09-30-1960 DOA: 11/28/2021 PCP: Pcp, No   Brief Narrative:   Adam Henson is a pleasant 61 y.o. male who denies any significant past medical history though has not seen a physician in a few years and now presents with 3 weeks of bilateral lower extremity swelling, shortness of breath, orthopnea, and palpitations.  Assessment & Plan:   Principal Problem:   Atrial fibrillation with RVR (HCC) Active Problems:   Acute CHF (HCC)   Elevated troponin   Elevated LFTs   Stage 3b chronic kidney disease (CKD) (HCC)   New-onset atrial fibrillation with RVR  -Unclear if provoked by heart failure exacerbation or vice versa -Edema resolving with diuretics and rate control -Transition off Cardizem drip, start low-dose metoprolol -CHA2DS2-VASc of 0, no indication for anticoagulation at this point   Presumed new onset acute heart failure exacerbation, unknown EF -Unclear if provoked by A-fib as above or if heart failure exacerbation had provoked A-fib -Rapid ventricular response with low cardiac output could explain patient's transient edema and volume overload now resolved with heart rate control and single dose of IV diuretics -Hold off on further diuretics at this time given patient is approaching euvolemia on his own -Follow urine output I's and O's Daily weights  Elevated troponin  -Minimal and downtrending, unlikely ACS -Secondary to demand ischemia in setting of RVR and hypervolemia  Elevated LFTs  -Again likely secondary to demand ischemia in setting of RVR and hypervolemia/congestive hepatopathy -Hepatitis HIV unremarkable INR minimally elevated at 1.4  AKI versus previously undocumented CKD -Distant history creatinine 1.5 -1.9 at intake downtrending appropriately -Follow I's and O's, avoid IV fluids in the setting of above    DVT prophylaxis: Lovenox  Code Status: Full  Family Communication: None present  Status is:  Inpatient  Dispo: The patient is from: Home              Anticipated d/c is to: Home              Anticipated d/c date is: 40 to 72 hours              Patient currently not medically stable for discharge  Consultants:  None  Procedures:  None  Antimicrobials:  None  Subjective: No acute issues or events denies nausea vomiting diarrhea constipation headache fevers chills or chest pain  Objective: Vitals:   11/29/21 0530 11/29/21 0545 11/29/21 0600 11/29/21 0700  BP: 96/68 101/81 96/71 104/79  Pulse: 65 (!) 57 (!) 50 (!) 47  Resp: 18 15 18 17   Temp:      TempSrc:      SpO2: 91% 94% 93% 92%    Intake/Output Summary (Last 24 hours) at 11/29/2021 0801 Last data filed at 11/29/2021 0157 Gross per 24 hour  Intake --  Output 3925 ml  Net -3925 ml   There were no vitals filed for this visit.  Examination:  General:  Pleasantly resting in bed, No acute distress. HEENT:  Normocephalic atraumatic.  Sclerae nonicteric, noninjected.  Extraocular movements intact bilaterally. Neck:  Without mass or deformity.  Trachea is midline. Lungs:  Clear to auscultate bilaterally without rhonchi, wheeze, or rales. Heart: Irregularly irregular; without murmurs, rubs, or gallops. Abdomen:  Soft, nontender, nondistended.  Without guarding or rebound. Extremities: Without cyanosis, clubbing, edema, or obvious deformity. Vascular:  Dorsalis pedis and posterior tibial pulses palpable bilaterally. Skin:  Warm and dry, no erythema, no ulcerations.   Data Reviewed:  I have personally reviewed following labs and imaging studies  CBC: Recent Labs  Lab 11/28/21 2126 11/29/21 0535  WBC 11.0* 9.7  NEUTROABS 7.0  --   HGB 16.3 14.8  HCT 48.7 44.1  MCV 90.2 88.4  PLT 206 172   Basic Metabolic Panel: Recent Labs  Lab 11/28/21 2126 11/29/21 0535  NA 140 141  K 5.1 3.7  CL 105 105  CO2 25 26  GLUCOSE 100* 109*  BUN 27* 27*  CREATININE 1.92* 1.86*  CALCIUM 9.0 8.7*  MG 2.2  --     GFR: CrCl cannot be calculated (Unknown ideal weight.). Liver Function Tests: Recent Labs  Lab 11/28/21 2126 11/29/21 0535  AST 65* 51*  ALT 67* 60*  ALKPHOS 50 47  BILITOT 3.2* 1.9*  PROT 6.0* 5.4*  ALBUMIN 3.6 3.1*   No results for input(s): "LIPASE", "AMYLASE" in the last 168 hours. No results for input(s): "AMMONIA" in the last 168 hours. Coagulation Profile: Recent Labs  Lab 11/29/21 0535  INR 1.4*   Cardiac Enzymes: No results for input(s): "CKTOTAL", "CKMB", "CKMBINDEX", "TROPONINI" in the last 168 hours. BNP (last 3 results) No results for input(s): "PROBNP" in the last 8760 hours. HbA1C: No results for input(s): "HGBA1C" in the last 72 hours. CBG: No results for input(s): "GLUCAP" in the last 168 hours. Lipid Profile: No results for input(s): "CHOL", "HDL", "LDLCALC", "TRIG", "CHOLHDL", "LDLDIRECT" in the last 72 hours. Thyroid Function Tests: Recent Labs    11/28/21 2356  TSH 3.081   Anemia Panel: No results for input(s): "VITAMINB12", "FOLATE", "FERRITIN", "TIBC", "IRON", "RETICCTPCT" in the last 72 hours. Sepsis Labs: No results for input(s): "PROCALCITON", "LATICACIDVEN" in the last 168 hours.  No results found for this or any previous visit (from the past 240 hour(s)).       Radiology Studies: US Abdomen Limited RUQ (LIVER/GB)  Result Date: 11/29/2021 CLINICAL DATA:  61 year old male with abnormal LFTs. EXAM: ULTRASOUND ABDOMEN LIMITED RIGHT UPPER QUADRANT COMPARISON:  None Available. FINDINGS: Gallbladder: No gallstones or wall thickening visualized. No sonographic Murphy sign noted by sonographer. Common bile duct: Diameter: 3 mm, normal. Liver: No focal lesion identified. Within normal limits in parenchymal echogenicity. Portal vein is patent on color Doppler imaging with normal direction of blood flow towards the liver. Other: But there is a right pleural effusion visible (image 42), and evidence of trace perihepatic ascites (image 56).  Negative visible right kidney. IMPRESSION: 1. Negative ultrasound appearance of the liver and gallbladder. 2. But trace perihepatic ascites, and a right pleural effusion is visible. Electronically Signed   By: Odessa Fleming M.D.   On: 11/29/2021 05:26   DG Chest Port 1 View  Result Date: 11/28/2021 CLINICAL DATA:  Shortness of breath. EXAM: PORTABLE CHEST 1 VIEW COMPARISON:  Chest radiograph dated 04/15/2016. FINDINGS: There is slight blunting of the costophrenic angles which may represent atelectasis, or small pleural effusions. No focal consolidation or pneumothorax. Borderline cardiomegaly. No acute osseous pathology. IMPRESSION: Small bilateral pleural effusions versus atelectasis. No focal consolidation. Electronically Signed   By: Elgie Collard M.D.   On: 11/28/2021 22:30    Scheduled Meds:  enoxaparin (LOVENOX) injection  40 mg Subcutaneous Daily   Continuous Infusions:  diltiazem (CARDIZEM) infusion 12.5 mg/hr (11/28/21 2330)    LOS: 1 day   Time spent:  Azucena Fallen, DO [facility]  If 7PM-7AM, please contact night-coverage [email or web address]  11/29/2021, 8:01 AM

## 2021-11-29 NOTE — H&P (Signed)
History and Physical    Adam Henson [id number] DOB: 1961-01-26 DOA: 11/28/2021  PCP: Pcp, No   Patient coming from: Home   Chief Complaint: Leg swelling, SOB, palpitations   HPI: Adam Henson is a pleasant 61 y.o. male who denies any significant past medical history though has not seen a physician in a few years and now presents with 3 weeks of bilateral lower extremity swelling, shortness of breath, orthopnea, and palpitations.  Patient reports that he is typically very active and exercises in the gym doing cardio and weights for up to an hour several times a week but developed exertional dyspnea, orthopnea, and bilateral lower extremity edema over the past 3 weeks that is limited his activities.  Initially, leg swelling would resolve with elevation, but has now been worsening despite that.  He has had to sleep sitting up in a chair the past few nights.  ED Course: Upon arrival to the ED, patient is found to be afebrile and saturating well on room air with mild tachypnea, heart rate as high as 170s, and SBP low 100s.  EKG features rapid atrial fibrillation with rate 170 and repolarization abnormality.  Chest x-ray notable for small bilateral pleural effusions.  Chemistry panel features a creatinine 1.95, AST 65, ALT 67, and total bilirubin 3.2.  CBC notable for slight leukocytosis.  Troponin was 23 and then 52.  BNP elevated 640.  Patient was given 20 mg IV diltiazem and started on diltiazem infusion in the ED.  He was also given 40 mg IV Lasix in the ED.  Review of Systems:  All other systems reviewed and apart from HPI, are negative.  History reviewed. No pertinent past medical history.  Past Surgical History:  Procedure Laterality Date   JOINT REPLACEMENT     MANDIBLE FRACTURE SURGERY      Social History:   reports that he quit smoking about 34 years ago. His smoking use included cigarettes. He has never used smokeless tobacco. He reports that he does not drink alcohol and  does not use drugs.  No Known Allergies  Family History  Problem Relation Age of Onset   Lung cancer Father        deceased   Cancer Father    Breast cancer Mother    Cancer Mother      Prior to Admission medications   Medication Sig Start Date End Date Taking? Authorizing Provider  BLACK CURRANT SEED OIL PO Take 1 capsule by mouth daily.   Yes [provider]  ELDERBERRY PO Take 1 capsule by mouth daily.   Yes [provider]  polyethylene glycol (MIRALAX / GLYCOLAX) 17 g packet Take 17 g by mouth daily as needed for mild constipation or moderate constipation.   Yes [provider]  senna (SENOKOT) 8.6 MG tablet Take 1 tablet by mouth daily as needed for constipation.   Yes [provider]  naproxen (NAPROSYN) 500 MG tablet Take 1 tablet (500 mg total) by mouth 2 (two) times daily with a meal. Patient not taking: Reported on 11/28/2021 12/17/20   Jaynee Eagles, PA-C  predniSONE (DELTASONE) 20 MG tablet Take 2 pills a day for 3 days, then 1 pill a day for 3 days Patient not taking: Reported on 11/28/2021 09/26/13   Copland, Gay Filler, MD  tiZANidine (ZANAFLEX) 4 MG tablet Take 1 tablet (4 mg total) by mouth every 8 (eight) hours as needed. Patient not taking: Reported on 11/28/2021 12/17/20   Jaynee Eagles, PA-C  Physical Exam: Vitals:   11/29/21 0245 11/29/21 0300 11/29/21 0315 11/29/21 0330  BP: 112/85 101/75 103/84 100/78  Pulse: (!) 51 (!) 48 (!) 51 91  Resp: (!) 22 (!) 25 20 (!) 22  Temp:      TempSrc:      SpO2: 95% 92% 93% 95%    Constitutional: NAD, calm  Eyes: PERTLA, lids and conjunctivae normal ENMT: Mucous membranes are moist. Posterior pharynx clear of any exudate or lesions.   Neck: supple, no masses  Respiratory:  no wheezing, no crackles. No accessory muscle use.  Cardiovascular: Rate ~100 and irregularly irregular. Bilateral LE pitting edema.  Abdomen: No distension, no tenderness, soft. Bowel sounds active.  Musculoskeletal: no  clubbing / cyanosis. No joint deformity upper and lower extremities.   Skin: no significant rashes, lesions, ulcers. Warm, dry, well-perfused. Neurologic: CN 2-12 grossly intact. Moving all extremities. Alert and oriented.  Psychiatric: Very pleasant. Cooperative.    Labs and Imaging on Admission: I have personally reviewed following labs and imaging studies  CBC: Recent Labs  Lab 11/28/21 2126  WBC 11.0*  NEUTROABS 7.0  HGB 16.3  HCT 48.7  MCV 90.2  PLT 333   Basic Metabolic Panel: Recent Labs  Lab 11/28/21 2126  NA 140  K 5.1  CL 105  CO2 25  GLUCOSE 100*  BUN 27*  CREATININE 1.92*  CALCIUM 9.0  MG 2.2   GFR: CrCl cannot be calculated (Unknown ideal weight.). Liver Function Tests: Recent Labs  Lab 11/28/21 2126  AST 65*  ALT 67*  ALKPHOS 50  BILITOT 3.2*  PROT 6.0*  ALBUMIN 3.6   No results for input(s): "LIPASE", "AMYLASE" in the last 168 hours. No results for input(s): "AMMONIA" in the last 168 hours. Coagulation Profile: No results for input(s): "INR", "PROTIME" in the last 168 hours. Cardiac Enzymes: No results for input(s): "CKTOTAL", "CKMB", "CKMBINDEX", "TROPONINI" in the last 168 hours. BNP (last 3 results) No results for input(s): "PROBNP" in the last 8760 hours. HbA1C: No results for input(s): "HGBA1C" in the last 72 hours. CBG: No results for input(s): "GLUCAP" in the last 168 hours. Lipid Profile: No results for input(s): "CHOL", "HDL", "LDLCALC", "TRIG", "CHOLHDL", "LDLDIRECT" in the last 72 hours. Thyroid Function Tests: Recent Labs    11/28/21 2356  TSH 3.081   Anemia Panel: No results for input(s): "VITAMINB12", "FOLATE", "FERRITIN", "TIBC", "IRON", "RETICCTPCT" in the last 72 hours. Urine analysis:    Component Value Date/Time   LABSPEC 1.025 12/17/2020 0949   PHURINE 6.0 12/17/2020 0949   GLUCOSEU NEGATIVE 12/17/2020 0949   HGBUR TRACE (A) 12/17/2020 0949   BILIRUBINUR NEGATIVE 12/17/2020 0949   KETONESUR NEGATIVE  12/17/2020 0949   PROTEINUR NEGATIVE 12/17/2020 0949   UROBILINOGEN 1.0 12/17/2020 0949   NITRITE NEGATIVE 12/17/2020 0949   LEUKOCYTESUR NEGATIVE 12/17/2020 0949   Sepsis Labs: _0 (procalcitonin:4,lacticidven:4) )No results found for this or any previous visit (from the past 240 hour(s)).   Radiological Exams on Admission: DG Chest Port 1 View  Result Date: 11/28/2021 CLINICAL DATA:  Shortness of breath. EXAM: PORTABLE CHEST 1 VIEW COMPARISON:  Chest radiograph dated 04/15/2016. FINDINGS: There is slight blunting of the costophrenic angles which may represent atelectasis, or small pleural effusions. No focal consolidation or pneumothorax. Borderline cardiomegaly. No acute osseous pathology. IMPRESSION: Small bilateral pleural effusions versus atelectasis. No focal consolidation. Electronically Signed   By: Anner Crete M.D.   On: 11/28/2021 22:30    EKG: Independently reviewed. Atrial fibrillation with RVR, rate 170, repolarization  abnormality.   Assessment/Plan   1. New-onset atrial fibrillation with RVR  - Presents with 3 wks of b/l leg swelling, SOB, and palpitations and found to be in rapid a fib with rates as high as 170s  - Unclear precipitant  - Given 20 mg IV diltiazem and started on diltiazem infusion in ED  - Continue IV diltiazem for now while checking echocardiogram  - CHADS-VASc is zero for now, will hold off on anticoagulating    2. Acute CHF  - Presents with ~3 weeks b/l LE edema, SOB, orthopnea, and palpitations and found to be in rapid a fib with BNP 640 and marked b/l LE swelling  - Likely precipitated by rapid a fib  - Given 40 mg IV Lasix in ED and is diuresing well   - Continue rate-control, check echo, monitor weight and I/Os   3. Elevated troponin  - Troponin was 23 then 52 in ED  - Likely demand ischemia in setting of rapid atrial fibrillation  - Trend troponin, follow-up echo results    4. Elevated LFTs  - Transaminases in 60s and t bili 3.2  in ED with normal alk phos and no abd pain  - ?congestive hepatopathy  - Check INR, viral hepatitis panel, and RUQ [place], trend LFTs    5. CKD IIIb  - SCr is 1.92 on admission, up from 1.5 in 2015  - Baseline unclear, may improve with rate-control and diuresis  - Renally-dose medications, monitor    DVT prophylaxis: Lovenox  Code Status: Full  Level of Care: Level of care: Progressive Family Communication: None present  Disposition Plan:  Patient is from: home  Anticipated d/c is to: Home  Anticipated d/c date is: 11/30/21  Patient currently: pending rate-control, conversion to oral medications, echocardiogram, stable renal function, RUQ [place]  Consults called: none  Admission status: Inpatient     Vianne Bulls, MD [facility]  11/29/2021, 4:24 AM

## 2021-11-30 ENCOUNTER — Encounter (HOSPITAL_COMMUNITY): Payer: Self-pay | Admitting: Family Medicine

## 2021-11-30 DIAGNOSIS — I5021 Acute systolic (congestive) heart failure: Secondary | ICD-10-CM | POA: Diagnosis not present

## 2021-11-30 DIAGNOSIS — R7989 Other specified abnormal findings of blood chemistry: Secondary | ICD-10-CM | POA: Diagnosis not present

## 2021-11-30 DIAGNOSIS — I4891 Unspecified atrial fibrillation: Secondary | ICD-10-CM | POA: Diagnosis not present

## 2021-11-30 DIAGNOSIS — R778 Other specified abnormalities of plasma proteins: Secondary | ICD-10-CM | POA: Diagnosis not present

## 2021-11-30 LAB — CBC
HCT: 43.6 % (ref 39.0–52.0)
Hemoglobin: 14.4 g/dL (ref 13.0–17.0)
MCH: 29.1 pg (ref 26.0–34.0)
MCHC: 33 g/dL (ref 30.0–36.0)
MCV: 88.1 fL (ref 80.0–100.0)
Platelets: 161 10*3/uL (ref 150–400)
RBC: 4.95 MIL/uL (ref 4.22–5.81)
RDW: 13.4 % (ref 11.5–15.5)
WBC: 9.6 10*3/uL (ref 4.0–10.5)
nRBC: 0 % (ref 0.0–0.2)

## 2021-11-30 LAB — COMPREHENSIVE METABOLIC PANEL
ALT: 59 U/L — ABNORMAL HIGH (ref 0–44)
AST: 45 U/L — ABNORMAL HIGH (ref 15–41)
Albumin: 3.1 g/dL — ABNORMAL LOW (ref 3.5–5.0)
Alkaline Phosphatase: 45 U/L (ref 38–126)
Anion gap: 8 (ref 5–15)
BUN: 25 mg/dL — ABNORMAL HIGH (ref 6–20)
CO2: 23 mmol/L (ref 22–32)
Calcium: 8.2 mg/dL — ABNORMAL LOW (ref 8.9–10.3)
Chloride: 103 mmol/L (ref 98–111)
Creatinine, Ser: 1.75 mg/dL — ABNORMAL HIGH (ref 0.61–1.24)
GFR, Estimated: 44 mL/min — ABNORMAL LOW (ref >60)
Glucose, Bld: 100 mg/dL — ABNORMAL HIGH (ref 70–99)
Potassium: 3.6 mmol/L (ref 3.5–5.1)
Sodium: 134 mmol/L — ABNORMAL LOW (ref 135–145)
Total Bilirubin: 1.2 mg/dL (ref 0.3–1.2)
Total Protein: 5.5 g/dL — ABNORMAL LOW (ref 6.5–8.1)

## 2021-11-30 LAB — HEPARIN LEVEL (UNFRACTIONATED): Heparin Unfractionated: 0.71 IU/mL — ABNORMAL HIGH (ref 0.30–0.70)

## 2021-11-30 MED ORDER — ENSURE ENLIVE PO LIQD
237.0000 mL | Freq: Two times a day (BID) | ORAL | Status: DC
  Administered 2021-11-30 – 2021-12-04 (×6): 237 mL via ORAL

## 2021-11-30 MED ORDER — ADULT MULTIVITAMIN W/MINERALS CH
1.0000 | ORAL_TABLET | Freq: Every day | ORAL | Status: DC
  Administered 2021-11-30 – 2021-12-08 (×8): 1 via ORAL
  Filled 2021-11-30 (×8): qty 1

## 2021-11-30 MED ORDER — FUROSEMIDE 10 MG/ML IJ SOLN
40.0000 mg | Freq: Two times a day (BID) | INTRAMUSCULAR | Status: DC
  Administered 2021-11-30 – 2021-12-02 (×5): 40 mg via INTRAVENOUS
  Filled 2021-11-30 (×5): qty 4

## 2021-11-30 MED ORDER — HEPARIN (PORCINE) 25000 UT/250ML-% IV SOLN
1450.0000 [IU]/h | INTRAVENOUS | Status: DC
  Administered 2021-11-30: 1400 [IU]/h via INTRAVENOUS
  Administered 2021-12-01 – 2021-12-02 (×3): 1300 [IU]/h via INTRAVENOUS
  Administered 2021-12-03: 1350 [IU]/h via INTRAVENOUS
  Administered 2021-12-04 – 2021-12-05 (×2): 1450 [IU]/h via INTRAVENOUS
  Filled 2021-11-30 (×8): qty 250

## 2021-11-30 MED ORDER — METOPROLOL TARTRATE 25 MG PO TABS
25.0000 mg | ORAL_TABLET | Freq: Two times a day (BID) | ORAL | Status: DC
  Administered 2021-11-30: 25 mg via ORAL
  Filled 2021-11-30: qty 1

## 2021-11-30 MED ORDER — METOPROLOL TARTRATE 25 MG PO TABS
25.0000 mg | ORAL_TABLET | Freq: Once | ORAL | Status: AC
Start: 2021-11-30 — End: 2021-11-30
  Administered 2021-11-30: 25 mg via ORAL
  Filled 2021-11-30: qty 1

## 2021-11-30 MED ORDER — HEPARIN BOLUS VIA INFUSION
4000.0000 [IU] | Freq: Once | INTRAVENOUS | Status: AC
  Administered 2021-11-30: 4000 [IU] via INTRAVENOUS
  Filled 2021-11-30: qty 4000

## 2021-11-30 MED ORDER — METOPROLOL TARTRATE 50 MG PO TABS
50.0000 mg | ORAL_TABLET | Freq: Two times a day (BID) | ORAL | Status: DC
  Administered 2021-11-30 – 2021-12-01 (×2): 50 mg via ORAL
  Filled 2021-11-30 (×2): qty 1

## 2021-11-30 NOTE — Progress Notes (Signed)
Report of elevated heart rate from telemetry monitor tech 120-140.  Patient was in the bathroom at the time and upon returning to bed his heart rate continued to sustain in the 120's to low 140's.  BP was stable and patient was asymptomatic.  Dr. Arlean Hopping notified via text page and new orders were placed in [facility] and carried out.  Nursing staff to continue to monitor.

## 2021-11-30 NOTE — Progress Notes (Signed)
Pt HR from 100-140. Pt was taking bath prior. Advised to lay down. No med's to give. Asymptomatic. MD notified.

## 2021-11-30 NOTE — Progress Notes (Signed)
[facility] floor coverage for both [facility] and [facility] (remote) on night of 11/29/21 into morning of 11/30/21:    I was notified by RN that this patient, who has reportedly been in atrial fibrillation since admission on the evening of 11/28/2021, is experiencing escalated heart rate/into the 120s to low 140s consistent with A-fib RVR.  This is relative to her rates in the low 100s earlier in the evening.  He is currently asymptomatic.  Blood pressure stable/normotensive.  No new supplemental oxygen requirements.  Respiratory rate low 20s.  Per my chart review, patient was admitted for new onset atrial fibrillation, tension in the setting of new diagnosis of heart failure, with patient suspected to be approaching euvolemia, per my review of most recent hospitalist progress note.  He was started on low-dose metoprolol tartrate 12.5 mg p.o. twice daily after being weaned off of diltiazem drip yesterday morning.   Given the patient is currently asymptomatic and tolerating these heart rates from a blood pressure standpoint, and that the patient does not appear significantly volume overloaded at this time, will increase dose of metoprolol tartrate to 25 mg p.o. twice daily, first dose now.    Newton Pigg, DO Hospitalist

## 2021-11-30 NOTE — Progress Notes (Signed)
ANTICOAGULATION CONSULT NOTE  Pharmacy Consult for heparin Indication: atrial fibrillation  No Known Allergies  Patient Measurements: Height: 5\' 11"  (180.3 cm) Weight: 101.1 kg (222 lb 14.2 oz) IBW/kg (Calculated) : 75.3 Heparin Dosing Weight: 96.2 kg  Vital Signs: Temp: 98.1 F (36.7 C) (06/17 1700) Temp Source: Oral (06/17 1700) BP: 95/61 (06/17 1700) Pulse Rate: 109 (06/17 1657)  Labs: Recent Labs    11/28/21 2126 11/28/21 2356 11/29/21 0535 11/30/21 0215 11/30/21 1807  HGB 16.3  --  14.8 14.4  --   HCT 48.7  --  44.1 43.6  --   PLT 206  --  172 161  --   LABPROT  --   --  17.1*  --   --   INR  --   --  1.4*  --   --   HEPARINUNFRC  --   --   --   --  0.71*  CREATININE 1.92*  --  1.86* 1.75*  --   TROPONINIHS 23* 52* 573*  --   --      Estimated Creatinine Clearance: 54.3 mL/min (A) (by C-G formula based on SCr of 1.75 mg/dL (H)).   Medical History: Past Medical History:  Diagnosis Date   Hyperlipidemia     Medications:  Medications Prior to Admission  Medication Sig Dispense Refill Last Dose   BLACK CURRANT SEED OIL PO Take 1 capsule by mouth daily.   11/28/2021   ELDERBERRY PO Take 1 capsule by mouth daily.   11/28/2021   polyethylene glycol (MIRALAX / GLYCOLAX) 17 g packet Take 17 g by mouth daily as needed for mild constipation or moderate constipation.   11/28/2021   senna (SENOKOT) 8.6 MG tablet Take 1 tablet by mouth daily as needed for constipation.   11/28/2021   naproxen (NAPROSYN) 500 MG tablet Take 1 tablet (500 mg total) by mouth 2 (two) times daily with a meal. (Patient not taking: Reported on 11/28/2021) 30 tablet 0 Not Taking   predniSONE (DELTASONE) 20 MG tablet Take 2 pills a day for 3 days, then 1 pill a day for 3 days (Patient not taking: Reported on 11/28/2021) 9 tablet 0 Not Taking   tiZANidine (ZANAFLEX) 4 MG tablet Take 1 tablet (4 mg total) by mouth every 8 (eight) hours as needed. (Patient not taking: Reported on 11/28/2021) 30 tablet 0  Not Taking   Scheduled:   feeding supplement  237 mL Oral BID BM   furosemide  40 mg Intravenous BID   metoprolol tartrate  50 mg Oral BID   multivitamin with minerals  1 tablet Oral Daily   Infusions:   heparin 1,400 Units/hr (11/30/21 1228)    Assessment: Pt is a 61 yo male who presents with new onset afib and heart failure. Pharmacy consutled to dose heparin. CBC WNL. Patient is not on anticoagulation PTA.   Initial heparin level is slightly supratherapeutic at 0.71. No infusion or bleeding issues noted.  Goal of Therapy:  Heparin level 0.3-0.7 units/ml Monitor platelets by anticoagulation protocol: Yes    Plan:  Decrease heparin infusion to 1300 units/hr Check anti-Xa level in 6-8 hours and daily while on heparin Continue to monitor H&H and platelets    Thank you for allowing 67 to participate in this patients care. [place], PharmD 11/30/2021 6:45 PM  **Pharmacist phone directory can be found on [email or web address] listed under [facility]**

## 2021-11-30 NOTE — Progress Notes (Addendum)
ANTICOAGULATION CONSULT NOTE - Initial Consult  Pharmacy Consult for heparin Indication: atrial fibrillation  No Known Allergies  Patient Measurements: Weight: 101.1 kg (222 lb 12.8 oz) Heparin Dosing Weight: 96.2 kg  Vital Signs: Temp: 98.4 F (36.9 C) (06/17 0854) Temp Source: Oral (06/17 0854) BP: 124/101 (06/17 0854) Pulse Rate: 127 (06/17 0428)  Labs: Recent Labs    11/28/21 2126 11/28/21 2356 11/29/21 0535 11/30/21 0215  HGB 16.3  --  14.8 14.4  HCT 48.7  --  44.1 43.6  PLT 206  --  172 161  LABPROT  --   --  17.1*  --   INR  --   --  1.4*  --   CREATININE 1.92*  --  1.86* 1.75*  TROPONINIHS 23* 52* 573*  --     CrCl cannot be calculated (Unknown ideal weight.).   Medical History: Past Medical History:  Diagnosis Date   Hyperlipidemia     Medications:  Medications Prior to Admission  Medication Sig Dispense Refill Last Dose   BLACK CURRANT SEED OIL PO Take 1 capsule by mouth daily.   11/28/2021   ELDERBERRY PO Take 1 capsule by mouth daily.   11/28/2021   polyethylene glycol (MIRALAX / GLYCOLAX) 17 g packet Take 17 g by mouth daily as needed for mild constipation or moderate constipation.   11/28/2021   senna (SENOKOT) 8.6 MG tablet Take 1 tablet by mouth daily as needed for constipation.   11/28/2021   naproxen (NAPROSYN) 500 MG tablet Take 1 tablet (500 mg total) by mouth 2 (two) times daily with a meal. (Patient not taking: Reported on 11/28/2021) 30 tablet 0 Not Taking   predniSONE (DELTASONE) 20 MG tablet Take 2 pills a day for 3 days, then 1 pill a day for 3 days (Patient not taking: Reported on 11/28/2021) 9 tablet 0 Not Taking   tiZANidine (ZANAFLEX) 4 MG tablet Take 1 tablet (4 mg total) by mouth every 8 (eight) hours as needed. (Patient not taking: Reported on 11/28/2021) 30 tablet 0 Not Taking   Scheduled:   feeding supplement  237 mL Oral BID BM   furosemide  40 mg Intravenous BID   metoprolol tartrate  50 mg Oral BID   multivitamin with minerals   1 tablet Oral Daily   Infusions:   Assessment: Pt is a 61 yo male who rpesents with new onset afib and heart failure. Pharmacy consutled to dose heparin. CBC WNL. Patient is not on anticoagulation PTA. Will continue to follow plans for long term coag.   Goal of Therapy:  Heparin level 0.3-0.7 units/ml Monitor platelets by anticoagulation protocol: Yes   Plan:  Heparin 4000 unit bolus Start heparin infusion at 1400 units/hr Heparin level in 6 hours and daily  Thank you for allowing pharmacy to participate in this patient's care.  Enos Fling, PharmD PGY1 Pharmacy Resident 11/30/2021 11:01 AM Check [email or web address] for unit specific pharmacy number

## 2021-11-30 NOTE — Progress Notes (Signed)
   11/30/21 1700  Assess: MEWS Score  Temp 98.1 F (36.7 C)  BP 95/61  ECG Heart Rate (!) 108  Resp 20  SpO2 100 %  O2 Device Room Air  Assess: MEWS Score  MEWS Temp 0  MEWS Systolic 1  MEWS Pulse 1  MEWS RR 0  MEWS LOC 0  MEWS Score 2  MEWS Score Color Yellow  Treat  Pain Scale 0-10  Pain Score 0  Take Vital Signs  Increase Vital Sign Frequency  Yellow: Q 2hr X 2 then Q 4hr X 2, if remains yellow, continue Q 4hrs  Escalate  MEWS: Escalate Yellow: discuss with charge nurse/RN and consider discussing with provider and RRT  Notify: Charge Nurse/RN  Name of Charge Nurse/RN Notified Terin Cragle  Date Charge Nurse/RN Notified 11/30/21  Time Charge Nurse/RN Notified 1714  Document  Patient Outcome Stabilized after interventions  Progress note created (see row info) Yes  Assess: SIRS CRITERIA  SIRS Temperature  0  SIRS Pulse 1  SIRS Respirations  0  SIRS WBC 0  SIRS Score Sum  1   Pt BP softer due to diuresing and HR increased however MD aware and new orders placed. Will keep monitor the patient. No c/o, pt asymptomatic.

## 2021-11-30 NOTE — Progress Notes (Signed)
PROGRESS NOTE    Adam Henson  [id number] DOB: 07-Feb-1961 DOA: 11/28/2021 PCP: Pcp, No   Brief Narrative:   Adam Henson is a pleasant 61 y.o. male who denies any significant past medical history though has not seen a physician in a few years and now presents with 3 weeks of bilateral lower extremity swelling, shortness of breath, orthopnea, and palpitations.  Assessment & Plan:   Principal Problem:   Atrial fibrillation with RVR (HCC) Active Problems:   Acute CHF (HCC)   Elevated troponin   Elevated LFTs   Stage 3b chronic kidney disease (CKD) (HCC)  New-onset atrial fibrillation with RVR, improving -Likely provoked by heart failure exacerbation, possible underlying other cardiac etiology -Cardiology consulted, appreciate insight and recommendations -Edema resolving with diuretics -continue Lasix 40 twice daily -Transition off Cardizem drip, start low-dose metoprolol -increase metoprolol to 25 twice daily -CHA2DS2-VASc of 0, no indication for anticoagulation at this point   New onset systolic heart failure, acute exacerbation -Cardiology consulted, appreciate insight and recommendations -Unclear if further work-up is necessary given hypokinesis on echo and new EF of 25% without notable history of cardiac dysfunction or disease -Continue diuretics IV Lasix twice daily  -Patient may benefit from Entresto/ARB/ACE pending further evaluation  Elevated troponin  -Minimally elevated but increasing overnight currently 500  Elevated LFTs  -Again likely secondary to demand ischemia in setting of RVR and hypervolemia/congestive hepatopathy -Hepatitis HIV unremarkable INR minimally elevated at 1.4  AKI versus previously undocumented CKD -Distant history creatinine 1.5 -1.9 at intake downtrending appropriately -Follow I's and O's, avoid IV fluids in the setting of above    DVT prophylaxis: Lovenox  Code Status: Full  Family Communication: None present  Status is:  Inpatient  Dispo: The patient is from: Home              Anticipated d/c is to: Home              Anticipated d/c date is: 48 to 72 hours              Patient currently not medically stable for discharge  Consultants:  None  Procedures:  None  Antimicrobials:  None  Subjective: Elevated heart rate overnight with exertion, somewhat improving today at rest, remains asymptomatic from a cardiac standpoint without chest pain shortness of breath nausea vomiting or diaphoresis  Objective: Vitals:   11/29/21 2002 11/29/21 2356 11/30/21 0428 11/30/21 0440  BP: 110/86 102/85 112/84 (!) 111/92  Pulse: 100 (!) 103 (!) 127   Resp: 20 20 20    Temp: 98.8 F (37.1 C) 98.4 F (36.9 C) 98.4 F (36.9 C)   TempSrc: Oral Oral Oral   SpO2: 95% 92% 92%   Weight:   101.1 kg     Intake/Output Summary (Last 24 hours) at 11/30/2021 0748 Last data filed at 11/30/2021 0433 Gross per 24 hour  Intake 246.87 ml  Output 1850 ml  Net -1603.13 ml    Filed Weights   11/30/21 0428  Weight: 101.1 kg    Examination:  General:  Pleasantly resting in bed, No acute distress. HEENT:  Normocephalic atraumatic.  Sclerae nonicteric, noninjected.  Extraocular movements intact bilaterally. Neck:  Without mass or deformity.  Trachea is midline. Lungs:  Clear to auscultate bilaterally without rhonchi, wheeze, or rales. Heart: Irregularly irregular; without murmurs, rubs, or gallops. Abdomen:  Soft, nontender, nondistended.  Without guarding or rebound. Extremities: Without cyanosis, clubbing, edema, or obvious deformity. Vascular:  Dorsalis pedis and posterior tibial pulses  palpable bilaterally. Skin:  Warm and dry, no erythema, no ulcerations.   Data Reviewed: I have personally reviewed following labs and imaging studies  CBC: Recent Labs  Lab 11/28/21 2126 11/29/21 0535 11/30/21 0215  WBC 11.0* 9.7 9.6  NEUTROABS 7.0  --   --   HGB 16.3 14.8 14.4  HCT 48.7 44.1 43.6  MCV 90.2 88.4 88.1  PLT  206 172 161    Basic Metabolic Panel: Recent Labs  Lab 11/28/21 2126 11/29/21 0535 11/30/21 0215  NA 140 141 134*  K 5.1 3.7 3.6  CL 105 105 103  CO2 25 26 23   GLUCOSE 100* 109* 100*  BUN 27* 27* 25*  CREATININE 1.92* 1.86* 1.75*  CALCIUM 9.0 8.7* 8.2*  MG 2.2  --   --     GFR: CrCl cannot be calculated (Unknown ideal weight.). Liver Function Tests: Recent Labs  Lab 11/28/21 2126 11/29/21 0535 11/30/21 0215  AST 65* 51* 45*  ALT 67* 60* 59*  ALKPHOS 50 47 45  BILITOT 3.2* 1.9* 1.2  PROT 6.0* 5.4* 5.5*  ALBUMIN 3.6 3.1* 3.1*    No results for input(s): "LIPASE", "AMYLASE" in the last 168 hours. No results for input(s): "AMMONIA" in the last 168 hours. Coagulation Profile: Recent Labs  Lab 11/29/21 0535  INR 1.4*    Cardiac Enzymes: No results for input(s): "CKTOTAL", "CKMB", "CKMBINDEX", "TROPONINI" in the last 168 hours. BNP (last 3 results) No results for input(s): "PROBNP" in the last 8760 hours. HbA1C: No results for input(s): "HGBA1C" in the last 72 hours. CBG: No results for input(s): "GLUCAP" in the last 168 hours. Lipid Profile: No results for input(s): "CHOL", "HDL", "LDLCALC", "TRIG", "CHOLHDL", "LDLDIRECT" in the last 72 hours. Thyroid Function Tests: Recent Labs    11/28/21 2356  TSH 3.081    Anemia Panel: No results for input(s): "VITAMINB12", "FOLATE", "FERRITIN", "TIBC", "IRON", "RETICCTPCT" in the last 72 hours. Sepsis Labs: No results for input(s): "PROCALCITON", "LATICACIDVEN" in the last 168 hours.  No results found for this or any previous visit (from the past 240 hour(s)).       Radiology Studies: ECHOCARDIOGRAM COMPLETE  Result Date: 11/29/2021    ECHOCARDIOGRAM REPORT   Patient Name:   Adam Henson Date of Exam: 11/29/2021 Medical Rec #:  12/01/2021      Height:       71.0 in Accession #:    [id number]     Weight:       232.0 lb Date of Birth:  [birth year]     BSA:          2.246 m Patient Age:    60 years       BP:            100/84 mmHg Patient Gender: M              HR:           82 bpm. Exam Location:  Inpatient Procedure: 2D Echo, Cardiac Doppler and Color Doppler Indications:    Afib  History:        Patient has no prior history of Echocardiogram examinations.  Sonographer:    05/27/1961 Referring Phys: Milda Smart Adam Henson IMPRESSIONS  1. Left ventricular ejection fraction, by estimation, is 25 to 30%. The left ventricle has severely decreased function. The left ventricle demonstrates global hypokinesis. The left ventricular internal cavity size was moderately dilated. Left ventricular diastolic parameters are indeterminate.  2. Right ventricular systolic function is moderately  reduced. The right ventricular size is mildly enlarged. There is moderately elevated pulmonary artery systolic pressure.  3. Left atrial size was moderately dilated.  4. The mitral valve is abnormal. Mild to moderate mitral valve regurgitation. No evidence of mitral stenosis.  5. Tricuspid valve regurgitation is mild to moderate.  6. The aortic valve is tricuspid. Aortic valve regurgitation is trivial. No aortic stenosis is present.  7. The inferior vena cava is dilated in size with >50% respiratory variability, suggesting right atrial pressure of 8 mmHg. FINDINGS  Left Ventricle: Left ventricular ejection fraction, by estimation, is 25 to 30%. The left ventricle has severely decreased function. The left ventricle demonstrates global hypokinesis. The left ventricular internal cavity size was moderately dilated. There is no left ventricular hypertrophy. Left ventricular diastolic parameters are indeterminate. Right Ventricle: The right ventricular size is mildly enlarged. No increase in right ventricular wall thickness. Right ventricular systolic function is moderately reduced. There is moderately elevated pulmonary artery systolic pressure. The tricuspid regurgitant velocity is 2.94 m/s, and with an assumed right atrial pressure of 15 mmHg, the  estimated right ventricular systolic pressure is [id number] mmHg. Left Atrium: Left atrial size was moderately dilated. Right Atrium: Right atrial size was normal in size. Pericardium: There is no evidence of pericardial effusion. Mitral Valve: The mitral valve is abnormal. There is mild thickening of the mitral valve leaflet(s). There is mild calcification of the mitral valve leaflet(s). Mild to moderate mitral valve regurgitation. No evidence of mitral valve stenosis. Tricuspid Valve: The tricuspid valve is normal in structure. Tricuspid valve regurgitation is mild to moderate. No evidence of tricuspid stenosis. Aortic Valve: The aortic valve is tricuspid. Aortic valve regurgitation is trivial. No aortic stenosis is present. Pulmonic Valve: The pulmonic valve was normal in structure. Pulmonic valve regurgitation is not visualized. No evidence of pulmonic stenosis. Aorta: The aortic root is normal in size and structure. Venous: The inferior vena cava is dilated in size with greater than 50% respiratory variability, suggesting right atrial pressure of 8 mmHg. IAS/Shunts: No atrial level shunt detected by color flow Doppler.  LEFT VENTRICLE PLAX 2D LVIDd:         6.10 cm LVIDs:         4.90 cm LV PW:         1.10 cm LV IVS:        1.10 cm LVOT diam:     2.40 cm LVOT Area:     4.52 cm  LV Volumes (MOD) LV vol d, MOD A2C: 194.0 ml LV vol d, MOD A4C: 195.0 ml LV vol s, MOD A2C: 118.0 ml LV vol s, MOD A4C: 129.0 ml LV SV MOD A2C:     76.0 ml LV SV MOD A4C:     195.0 ml LV SV MOD BP:      75.0 ml RIGHT VENTRICLE            IVC RV S prime:     8.76 cm/s  IVC diam: 2.90 cm TAPSE (M-mode): 1.1 cm LEFT ATRIUM              Index        RIGHT ATRIUM           Index LA diam:        4.50 cm  2.00 cm/m   RA Area:     25.70 cm LA Vol (A2C):   97.7 ml  43.50 ml/m  RA Volume:   88.60 ml  39.45 ml/m LA Vol (  A4C):   88.0 ml  39.18 ml/m LA Biplane Vol: 102.0 ml 45.42 ml/m   AORTA Ao Root diam: 3.20 cm Ao Asc diam:  3.30 cm MR Peak  grad: 57.2 mmHg   TRICUSPID VALVE MR Mean grad: 33.0 mmHg   TR Peak grad:   34.6 mmHg MR Vmax:      378.00 cm/s TR Vmax:        294.00 cm/s MR Vmean:     263.0 cm/s                           SHUNTS                           Systemic Diam: 2.40 cm Charlton Haws MD Electronically signed by Charlton Haws MD Signature Date/Time: 11/29/2021/4:21:27 PM    Final    US Abdomen Limited RUQ (LIVER/GB)  Result Date: 11/29/2021 CLINICAL DATA:  61 year old male with abnormal LFTs. EXAM: ULTRASOUND ABDOMEN LIMITED RIGHT UPPER QUADRANT COMPARISON:  None Available. FINDINGS: Gallbladder: No gallstones or wall thickening visualized. No sonographic Murphy sign noted by sonographer. Common bile duct: Diameter: 3 mm, normal. Liver: No focal lesion identified. Within normal limits in parenchymal echogenicity. Portal vein is patent on color Doppler imaging with normal direction of blood flow towards the liver. Other: But there is a right pleural effusion visible (image 42), and evidence of trace perihepatic ascites (image 56). Negative visible right kidney. IMPRESSION: 1. Negative ultrasound appearance of the liver and gallbladder. 2. But trace perihepatic ascites, and a right pleural effusion is visible. Electronically Signed   By: Odessa Fleming M.D.   On: 11/29/2021 05:26   DG Chest Port 1 View  Result Date: 11/28/2021 CLINICAL DATA:  Shortness of breath. EXAM: PORTABLE CHEST 1 VIEW COMPARISON:  Chest radiograph dated 04/15/2016. FINDINGS: There is slight blunting of the costophrenic angles which may represent atelectasis, or small pleural effusions. No focal consolidation or pneumothorax. Borderline cardiomegaly. No acute osseous pathology. IMPRESSION: Small bilateral pleural effusions versus atelectasis. No focal consolidation. Electronically Signed   By: Elgie Collard M.D.   On: 11/28/2021 22:30    Scheduled Meds:  enoxaparin (LOVENOX) injection  40 mg Subcutaneous Daily   metoprolol tartrate  25 mg Oral BID   Continuous  Infusions:    LOS: 2 days   Time spent:  Azucena Fallen, DO [facility]  If 7PM-7AM, please contact night-coverage [email or web address]  11/30/2021, 7:48 AM

## 2021-11-30 NOTE — Progress Notes (Signed)
Initial Nutrition Assessment  DOCUMENTATION CODES:   Obesity unspecified  INTERVENTION:   -Ensure Enlive po BID, each supplement provides 350 kcal and 20 grams of protein -MVI with minerals daily -Liberalize diet to regular for widest variety of meal selections  NUTRITION DIAGNOSIS:   Increased nutrient needs related to acute illness as evidenced by estimated needs.  GOAL:   Patient will meet greater than or equal to 90% of their needs  MONITOR:   PO intake, Supplement acceptance  REASON FOR ASSESSMENT:   Malnutrition Screening Tool    ASSESSMENT:   Pt with no significant past medical history (has not seen a physician in a few years) presents with 3 weeks of bilateral lower extremity swelling, shortness of breath, orthopnea, and palpitations.  Pt admitted with new onset a-fib with RVR.   Reviewed I/O's: -1.6 L x 24 hours and -5.5 L since admission  UOP: 1.9 L x 24 hours  Pt unavailable at time of visit. Attempted to speak with pt via call to hospital room phone, however, unable to reach. RD unable to obtain further nutrition-related history or complete nutrition-focused physical exam at this time.    Per H&P, pt was very active PTA and does cardio and weight lifting at the gym for an hour 3 times per week.   Pt currently on a heart healthy diet. No meal completion data available to assess at this time.   No recent wt hx available to assess, however, noted mild distant history of weight loss. Pt with moderate edema which may be masking true weight loss as well as fat and muscle depletions.    Medications reviewed.   Labs reviewed: Na: 134.   Diet Order:   Diet Order             Diet Heart Room service appropriate? Yes; Fluid consistency: Thin  Diet effective now                   EDUCATION NEEDS:   No education needs have been identified at this time  Skin:  Skin Assessment: Reviewed RN Assessment  Last BM:  11/30/21  Height:   Ht Readings from  Last 1 Encounters:  09/26/13 5\' 11"  (1.803 m)    Weight:   Wt Readings from Last 1 Encounters:  11/30/21 101.1 kg    Ideal Body Weight:  78.2 kg  BMI:  Body mass index is 31.07 kg/m.  Estimated Nutritional Needs:   Kcal:  2150-2350  Protein:  105-120 grams  Fluid:  > 2 L    05-24-1990, RD, LDN, CDCES Registered Dietitian II Certified Diabetes Care and Education Specialist Please refer to [facility] for RD and/or RD on-call/weekend/after hours pager

## 2021-11-30 NOTE — Consult Note (Signed)
Cardiology Consultation:   Patient ID: Adam Henson MRN: [id number]; DOB: 1961-02-06  Admit date: 11/28/2021 Date of Consult: 11/30/2021  PCP:  Merryl Hacker, No   [facility] Providers Cardiologist:  New to [facility] - Dr. Radford Pax    Patient Profile:   Adam Henson is a 61 y.o. male with a hx of HLD who is being seen 11/30/2021 for the evaluation of newly diagnosed atrial fibrillation with RVR and acute systolic heart failure at the request of Dr. Avon Gully.  History of Present Illness:   Adam Henson is a 61 year old male with past medical history of hyperlipidemia, no other significant past history.  Per patient, he had "on and off" diagnosis of hypertension, however never took any medication for this.  He does not have any prior cardiac history and has never seen by cardiologist in the past.  He does not have a primary care provider.  He says the last time he was seen by a physician was several years ago.  The last family medicine note I can find in our system was dating back to 87.  Patient was in her usual state of health until 3 weeks ago when he started noticing increasing dyspnea on exertion.  He is very active and exercises frequently in the gym.  During the same time, he also beginning to notice increasing lower extremity edema that does not go away even with leg elevation.  He has orthopnea and occasionally waking up in the middle of the night coughing up white phlegm.  This eventually prompted the patient to seek medical attention at [facility] ED on 11/28/2021.  On initial arrival, he was noted to patient was in atrial fibrillation with RVR with heart rate as high as 170 bpm.  Patient however does not have any cardiac awareness of A-fib.  Chest x-ray noted for small bilateral pleural effusion.  Creatinine was 1.95.  Patient was not aware of any history of renal problem.  AST and ALT was mildly elevated.  BNP elevated at 640.  Serial troponin 23--52--573.  Patient denies any recent  chest discomfort.  He underwent IV diuresis and so far put out close to 6 L of fluid.  Echocardiogram obtained on 11/29/2021 showed EF 25 to 30%, moderately reduced RV systolic function, moderately elevated PASP, mild to moderate MR, trivial AI, mild to moderate TR.  Patient was initially on IV diltiazem, this was weaned off and the switch to metoprolol heart rate.  Given uncontrolled heart rate, metoprolol further uptitrated to 25 mg twice a day.  TSH normal.  Cardiology consulted for atrial fibrillation with RVR and new onset of heart failure.   Past Medical History:  Diagnosis Date   Hyperlipidemia     Past Surgical History:  Procedure Laterality Date   JOINT REPLACEMENT     MANDIBLE FRACTURE SURGERY       Home Medications:  Prior to Admission medications   Medication Sig Start Date End Date Taking? Authorizing Provider  BLACK CURRANT SEED OIL PO Take 1 capsule by mouth daily.   Yes [provider]  ELDERBERRY PO Take 1 capsule by mouth daily.   Yes [provider]  polyethylene glycol (MIRALAX / GLYCOLAX) 17 g packet Take 17 g by mouth daily as needed for mild constipation or moderate constipation.   Yes [provider]  senna (SENOKOT) 8.6 MG tablet Take 1 tablet by mouth daily as needed for constipation.   Yes [provider]  naproxen (NAPROSYN) 500 MG tablet Take  1 tablet (500 mg total) by mouth 2 (two) times daily with a meal. Patient not taking: Reported on 11/28/2021 12/17/20   Wallis Bamberg, PA-C  predniSONE (DELTASONE) 20 MG tablet Take 2 pills a day for 3 days, then 1 pill a day for 3 days Patient not taking: Reported on 11/28/2021 09/26/13   Copland, Gwenlyn Found, MD  tiZANidine (ZANAFLEX) 4 MG tablet Take 1 tablet (4 mg total) by mouth every 8 (eight) hours as needed. Patient not taking: Reported on 11/28/2021 12/17/20   Wallis Bamberg, PA-C    Inpatient Medications: Scheduled Meds:  enoxaparin (LOVENOX) injection  40 mg Subcutaneous Daily   feeding  supplement  237 mL Oral BID BM   metoprolol tartrate  25 mg Oral Once   metoprolol tartrate  50 mg Oral BID   multivitamin with minerals  1 tablet Oral Daily   Continuous Infusions:  PRN Meds: acetaminophen, guaiFENesin-dextromethorphan, ondansetron (ZOFRAN) IV  Allergies:   No Known Allergies  Social History:   Social History   Socioeconomic History   Marital status: Single    Spouse name: Not on file   Number of children: Not on file   Years of education: Not on file   Highest education level: Not on file  Occupational History   Not on file  Tobacco Use   Smoking status: Former    Types: Cigarettes    Quit date: 09/24/1987    Years since quitting: 34.2   Smokeless tobacco: Never   Tobacco comments:    Smoked from age 63 until age 62  Substance and Sexual Activity   Alcohol use: No   Drug use: No   Sexual activity: Yes    Partners: Male  Other Topics Concern   Not on file  Social History Narrative   Not on file   Social Determinants of Health   Financial Resource Strain: Not on file  Food Insecurity: Not on file  Transportation Needs: Not on file  Physical Activity: Not on file  Stress: Not on file  Social Connections: Not on file  Intimate Partner Violence: Not on file    Family History:    Family History  Problem Relation Age of Onset   Hyperlipidemia Mother    Breast cancer Mother    Cancer Mother    Hyperlipidemia Father    Lung cancer Father        deceased   Cancer Father      ROS:  Please see the history of present illness.   All other ROS reviewed and negative.     Physical Exam/Data:   Vitals:   11/29/21 2356 11/30/21 0428 11/30/21 0440 11/30/21 0854  BP: 102/85 112/84 (!) 111/92 (!) 124/101  Pulse: (!) 103 (!) 127    Resp: 20 20  20   Temp: 98.4 F (36.9 C) 98.4 F (36.9 C)  98.4 F (36.9 C)  TempSrc: Oral Oral  Oral  SpO2: 92% 92%  100%  Weight:  101.1 kg      Intake/Output Summary (Last 24 hours) at 11/30/2021 1002 Last  data filed at 11/30/2021 0851 Gross per 24 hour  Intake 228.44 ml  Output 1600 ml  Net -1371.56 ml      11/30/2021    4:28 AM 09/26/2013    1:23 PM 09/23/2013   11:00 AM  Last 3 Weights  Weight (lbs) 222 lb 12.8 oz 232 lb 237 lb  Weight (kg) 101.061 kg 105.235 kg 107.502 kg     Body mass index is  31.07 kg/m.  General:  Well nourished, well developed, in no acute distress HEENT: normal Neck: no JVD Vascular: No carotid bruits; Distal pulses 2+ bilaterally Cardiac: Irregularly irregular; no murmur  Lungs:  clear to auscultation bilaterally, no wheezing, rhonchi or rales  Abd: soft, nontender, no hepatomegaly  Ext: no edema Musculoskeletal:  No deformities, BUE and BLE strength normal and equal Skin: warm and dry.  2+ pitting edema in bilateral lower extremity Neuro:  CNs 2-12 intact, no focal abnormalities noted Psych:  Normal affect   EKG:  The EKG was personally reviewed and demonstrates: Atrial fibrillation with RVR Telemetry:  Telemetry was personally reviewed and demonstrates: Atrial fibrillation with RVR, heart rates remain elevated at 110-130s.  Relevant CV Studies:  Echo 11/29/2021 1. Left ventricular ejection fraction, by estimation, is 25 to 30%. The  left ventricle has severely decreased function. The left ventricle  demonstrates global hypokinesis. The left ventricular internal cavity size  was moderately dilated. Left  ventricular diastolic parameters are indeterminate.   2. Right ventricular systolic function is moderately reduced. The right  ventricular size is mildly enlarged. There is moderately elevated  pulmonary artery systolic pressure.   3. Left atrial size was moderately dilated.   4. The mitral valve is abnormal. Mild to moderate mitral valve  regurgitation. No evidence of mitral stenosis.   5. Tricuspid valve regurgitation is mild to moderate.   6. The aortic valve is tricuspid. Aortic valve regurgitation is trivial.  No aortic stenosis is present.    7. The inferior vena cava is dilated in size with >50% respiratory  variability, suggesting right atrial pressure of 8 mmHg.   Laboratory Data:  High Sensitivity Troponin:   Recent Labs  Lab 11/28/21 2126 11/28/21 2356 11/29/21 0535  TROPONINIHS 23* 52* 573*     Chemistry Recent Labs  Lab 11/28/21 2126 11/29/21 0535 11/30/21 0215  NA 140 141 134*  K 5.1 3.7 3.6  CL 105 105 103  CO2 25 26 23   GLUCOSE 100* 109* 100*  BUN 27* 27* 25*  CREATININE 1.92* 1.86* 1.75*  CALCIUM 9.0 8.7* 8.2*  MG 2.2  --   --   GFRNONAA 39* 41* 44*  ANIONGAP 10 10 8     Recent Labs  Lab 11/28/21 2126 11/29/21 0535 11/30/21 0215  PROT 6.0* 5.4* 5.5*  ALBUMIN 3.6 3.1* 3.1*  AST 65* 51* 45*  ALT 67* 60* 59*  ALKPHOS 50 47 45  BILITOT 3.2* 1.9* 1.2   Lipids No results for input(s): "CHOL", "TRIG", "HDL", "LABVLDL", "LDLCALC", "CHOLHDL" in the last 168 hours.  Hematology Recent Labs  Lab 11/28/21 2126 11/29/21 0535 11/30/21 0215  WBC 11.0* 9.7 9.6  RBC 5.40 4.99 4.95  HGB 16.3 14.8 14.4  HCT 48.7 44.1 43.6  MCV 90.2 88.4 88.1  MCH 30.2 29.7 29.1  MCHC 33.5 33.6 33.0  RDW 13.2 13.2 13.4  PLT 206 172 161   Thyroid  Recent Labs  Lab 11/28/21 2356  TSH 3.081    BNP Recent Labs  Lab 11/28/21 2126  BNP 639.5*    DDimer No results for input(s): "DDIMER" in the last 168 hours.   Radiology/Studies:  ECHOCARDIOGRAM COMPLETE  Result Date: 11/29/2021    ECHOCARDIOGRAM REPORT   Patient Name:   Adam Henson Date of Exam: 11/29/2021 Medical Rec #:  Adam Henson      Height:       71.0 in Accession #:    12/01/2021     Weight:  232.0 lb Date of Birth:  Jul 19, 1960     BSA:          2.246 m Patient Age:    60 years       BP:           100/84 mmHg Patient Gender: M              HR:           82 bpm. Exam Location:  Inpatient Procedure: 2D Echo, Cardiac Doppler and Color Doppler Indications:    Afib  History:        Patient has no prior history of Echocardiogram examinations.   Sonographer:    Eartha Inch Referring Phys: [id number] [place]  1. Left ventricular ejection fraction, by estimation, is 25 to 30%. The left ventricle has severely decreased function. The left ventricle demonstrates global hypokinesis. The left ventricular internal cavity size was moderately dilated. Left ventricular diastolic parameters are indeterminate.  2. Right ventricular systolic function is moderately reduced. The right ventricular size is mildly enlarged. There is moderately elevated pulmonary artery systolic pressure.  3. Left atrial size was moderately dilated.  4. The mitral valve is abnormal. Mild to moderate mitral valve regurgitation. No evidence of mitral stenosis.  5. Tricuspid valve regurgitation is mild to moderate.  6. The aortic valve is tricuspid. Aortic valve regurgitation is trivial. No aortic stenosis is present.  7. The inferior vena cava is dilated in size with >50% respiratory variability, suggesting right atrial pressure of 8 mmHg. FINDINGS  Left Ventricle: Left ventricular ejection fraction, by estimation, is 25 to 30%. The left ventricle has severely decreased function. The left ventricle demonstrates global hypokinesis. The left ventricular internal cavity size was moderately dilated. There is no left ventricular hypertrophy. Left ventricular diastolic parameters are indeterminate. Right Ventricle: The right ventricular size is mildly enlarged. No increase in right ventricular wall thickness. Right ventricular systolic function is moderately reduced. There is moderately elevated pulmonary artery systolic pressure. The tricuspid regurgitant velocity is 2.94 m/s, and with an assumed right atrial pressure of 15 mmHg, the estimated right ventricular systolic pressure is [id number] mmHg. Left Atrium: Left atrial size was moderately dilated. Right Atrium: Right atrial size was normal in size. Pericardium: There is no evidence of pericardial effusion. Mitral Valve: The mitral  valve is abnormal. There is mild thickening of the mitral valve leaflet(s). There is mild calcification of the mitral valve leaflet(s). Mild to moderate mitral valve regurgitation. No evidence of mitral valve stenosis. Tricuspid Valve: The tricuspid valve is normal in structure. Tricuspid valve regurgitation is mild to moderate. No evidence of tricuspid stenosis. Aortic Valve: The aortic valve is tricuspid. Aortic valve regurgitation is trivial. No aortic stenosis is present. Pulmonic Valve: The pulmonic valve was normal in structure. Pulmonic valve regurgitation is not visualized. No evidence of pulmonic stenosis. Aorta: The aortic root is normal in size and structure. Venous: The inferior vena cava is dilated in size with greater than 50% respiratory variability, suggesting right atrial pressure of 8 mmHg. IAS/Shunts: No atrial level shunt detected by color flow Doppler.  LEFT VENTRICLE PLAX 2D LVIDd:         6.10 cm LVIDs:         4.90 cm LV PW:         1.10 cm LV IVS:        1.10 cm LVOT diam:     2.40 cm LVOT Area:     4.52 cm  LV  Volumes (MOD) LV vol d, MOD A2C: 194.0 ml LV vol d, MOD A4C: 195.0 ml LV vol s, MOD A2C: 118.0 ml LV vol s, MOD A4C: 129.0 ml LV SV MOD A2C:     76.0 ml LV SV MOD A4C:     195.0 ml LV SV MOD BP:      75.0 ml RIGHT VENTRICLE            IVC RV S prime:     8.76 cm/s  IVC diam: 2.90 cm TAPSE (M-mode): 1.1 cm LEFT ATRIUM              Index        RIGHT ATRIUM           Index LA diam:        4.50 cm  2.00 cm/m   RA Area:     25.70 cm LA Vol (A2C):   97.7 ml  43.50 ml/m  RA Volume:   88.60 ml  39.45 ml/m LA Vol (A4C):   88.0 ml  39.18 ml/m LA Biplane Vol: 102.0 ml 45.42 ml/m   AORTA Ao Root diam: 3.20 cm Ao Asc diam:  3.30 cm MR Peak grad: 57.2 mmHg   TRICUSPID VALVE MR Mean grad: 33.0 mmHg   TR Peak grad:   34.6 mmHg MR Vmax:      378.00 cm/s TR Vmax:        294.00 cm/s MR Vmean:     263.0 cm/s                           SHUNTS                           Systemic Diam: 2.40 cm Jenkins Rouge MD Electronically signed by Jenkins Rouge MD Signature Date/Time: 11/29/2021/4:21:27 PM    Final    US Abdomen Limited RUQ (LIVER/GB)  Result Date: 11/29/2021 CLINICAL DATA:  61 year old male with abnormal LFTs. EXAM: ULTRASOUND ABDOMEN LIMITED RIGHT UPPER QUADRANT COMPARISON:  None Available. FINDINGS: Gallbladder: No gallstones or wall thickening visualized. No sonographic Murphy sign noted by sonographer. Common bile duct: Diameter: 3 mm, normal. Liver: No focal lesion identified. Within normal limits in parenchymal echogenicity. Portal vein is patent on color Doppler imaging with normal direction of blood flow towards the liver. Other: But there is a right pleural effusion visible (image 42), and evidence of trace perihepatic ascites (image 56). Negative visible right kidney. IMPRESSION: 1. Negative ultrasound appearance of the liver and gallbladder. 2. But trace perihepatic ascites, and a right pleural effusion is visible. Electronically Signed   By: Genevie Ann M.D.   On: 11/29/2021 05:26   DG Chest Port 1 View  Result Date: 11/28/2021 CLINICAL DATA:  Shortness of breath. EXAM: PORTABLE CHEST 1 VIEW COMPARISON:  Chest radiograph dated 04/15/2016. FINDINGS: There is slight blunting of the costophrenic angles which may represent atelectasis, or small pleural effusions. No focal consolidation or pneumothorax. Borderline cardiomegaly. No acute osseous pathology. IMPRESSION: Small bilateral pleural effusions versus atelectasis. No focal consolidation. Electronically Signed   By: Anner Crete M.D.   On: 11/28/2021 22:30     Assessment and Plan:   Newly diagnosed atrial fibrillation with RVR  -We will discuss with MD.  IV diltiazem has been switched to metoprolol tartrate, initially at 12.5 mg twice a day, later switched to 25 mg twice a day starting this morning.  I  we will give the patient another dose of 25 mg metoprolol and further increase the dosage to 50 mg twice a day. -If unable to  control the heart rate, will need TEE cardioversion.  However, given elevation of the troponin, may also need ischemic work-up as well.  Acute systolic heart failure: EF 25 to 30% on echocardiogram.  Likely tachycardia mediated cardiomyopathy.  Serial troponin however trended up 23-->52-->573.  Patient denies any recent exertional chest pain and was quite active until 3 weeks ago.  Suspicion for ACS is fairly low, however likely will require a ischemic work-up  Hyperlipidemia: Per patient report.  LDL 169 in 2015.  We will repeat fasting lipid panel tomorrow morning.  Acute on chronic renal insufficiency: Patient arrived with creatinine of 1.92, his creatinine was 1.5 in March 2015.  After diuresis, creatinine slowly improved to 1.75 by this morning.   Risk Assessment/Risk Scores:      New York Heart Association (NYHA) Functional Class NYHA Class IV  CHA2DS2-VASc Score = 2   This indicates a 2.2% annual risk of stroke. The patient's score is based upon: CHF History: 1 HTN History: 1 Diabetes History: 0 Stroke History: 0 Vascular Disease History: 0 Age Score: 0 Gender Score: 0         For questions or updates, please contact [place] Please consult [email or web address] for contact info under    Hilbert Corrigan, [place]  11/30/2021 10:02 AM

## 2021-12-01 DIAGNOSIS — I5021 Acute systolic (congestive) heart failure: Secondary | ICD-10-CM

## 2021-12-01 DIAGNOSIS — R778 Other specified abnormalities of plasma proteins: Secondary | ICD-10-CM | POA: Diagnosis not present

## 2021-12-01 DIAGNOSIS — N179 Acute kidney failure, unspecified: Secondary | ICD-10-CM

## 2021-12-01 DIAGNOSIS — I4891 Unspecified atrial fibrillation: Secondary | ICD-10-CM | POA: Diagnosis not present

## 2021-12-01 DIAGNOSIS — R7989 Other specified abnormal findings of blood chemistry: Secondary | ICD-10-CM | POA: Diagnosis not present

## 2021-12-01 LAB — CBC
HCT: 44 % (ref 39.0–52.0)
Hemoglobin: 14.4 g/dL (ref 13.0–17.0)
MCH: 29.1 pg (ref 26.0–34.0)
MCHC: 32.7 g/dL (ref 30.0–36.0)
MCV: 88.9 fL (ref 80.0–100.0)
Platelets: 167 10*3/uL (ref 150–400)
RBC: 4.95 MIL/uL (ref 4.22–5.81)
RDW: 13.4 % (ref 11.5–15.5)
WBC: 9.6 10*3/uL (ref 4.0–10.5)
nRBC: 0 % (ref 0.0–0.2)

## 2021-12-01 LAB — COMPREHENSIVE METABOLIC PANEL
ALT: 50 U/L — ABNORMAL HIGH (ref 0–44)
AST: 34 U/L (ref 15–41)
Albumin: 2.9 g/dL — ABNORMAL LOW (ref 3.5–5.0)
Alkaline Phosphatase: 48 U/L (ref 38–126)
Anion gap: 11 (ref 5–15)
BUN: 23 mg/dL — ABNORMAL HIGH (ref 6–20)
CO2: 27 mmol/L (ref 22–32)
Calcium: 8.2 mg/dL — ABNORMAL LOW (ref 8.9–10.3)
Chloride: 100 mmol/L (ref 98–111)
Creatinine, Ser: 1.69 mg/dL — ABNORMAL HIGH (ref 0.61–1.24)
GFR, Estimated: 46 mL/min — ABNORMAL LOW (ref >60)
Glucose, Bld: 100 mg/dL — ABNORMAL HIGH (ref 70–99)
Potassium: 3.7 mmol/L (ref 3.5–5.1)
Sodium: 138 mmol/L (ref 135–145)
Total Bilirubin: 1.3 mg/dL — ABNORMAL HIGH (ref 0.3–1.2)
Total Protein: 5.3 g/dL — ABNORMAL LOW (ref 6.5–8.1)

## 2021-12-01 LAB — HEPARIN LEVEL (UNFRACTIONATED)
Heparin Unfractionated: 0.4 IU/mL (ref 0.30–0.70)
Heparin Unfractionated: 0.44 IU/mL (ref 0.30–0.70)

## 2021-12-01 LAB — LACTIC ACID, PLASMA: Lactic Acid, Venous: 1.7 mmol/L (ref 0.5–1.9)

## 2021-12-01 MED ORDER — AMIODARONE LOAD VIA INFUSION
150.0000 mg | Freq: Once | INTRAVENOUS | Status: AC
  Administered 2021-12-01: 150 mg via INTRAVENOUS
  Filled 2021-12-01: qty 83.34

## 2021-12-01 MED ORDER — METOPROLOL TARTRATE 25 MG PO TABS: 25.0000 mg | ORAL_TABLET | Freq: Two times a day (BID) | ORAL | Status: DC

## 2021-12-01 MED ORDER — POTASSIUM CHLORIDE CRYS ER 20 MEQ PO TBCR
40.0000 meq | EXTENDED_RELEASE_TABLET | Freq: Once | ORAL | Status: AC
Start: 2021-12-01 — End: 2021-12-01
  Administered 2021-12-01: 40 meq via ORAL
  Filled 2021-12-01: qty 2

## 2021-12-01 MED ORDER — AMIODARONE LOAD VIA INFUSION
150.0000 mg | Freq: Once | INTRAVENOUS | Status: DC
  Filled 2021-12-01: qty 83.34

## 2021-12-01 MED ORDER — AMIODARONE HCL IN DEXTROSE 360-4.14 MG/200ML-% IV SOLN: 30.0000 mg/h | INTRAVENOUS | Status: DC

## 2021-12-01 MED ORDER — AMIODARONE HCL IN DEXTROSE 360-4.14 MG/200ML-% IV SOLN
60.0000 mg/h | INTRAVENOUS | Status: DC
  Administered 2021-12-01: 60 mg/h via INTRAVENOUS
  Filled 2021-12-01: qty 200

## 2021-12-01 MED ORDER — MIDODRINE HCL 5 MG PO TABS
5.0000 mg | ORAL_TABLET | Freq: Three times a day (TID) | ORAL | Status: AC
  Administered 2021-12-01 (×2): 5 mg via ORAL
  Filled 2021-12-01 (×2): qty 1

## 2021-12-01 MED ORDER — SODIUM CHLORIDE 0.9% FLUSH
3.0000 mL | Freq: Two times a day (BID) | INTRAVENOUS | Status: DC
  Administered 2021-12-02 – 2021-12-07 (×4): 3 mL via INTRAVENOUS

## 2021-12-01 MED ORDER — AMIODARONE IV BOLUS ONLY 150 MG/100ML
150.0000 mg | Freq: Once | INTRAVENOUS | Status: DC
  Filled 2021-12-01: qty 100

## 2021-12-01 NOTE — H&P (View-Only) (Signed)
Progress Note  Patient Name: Adam Henson Date of Encounter: 12/01/2021  [facility] Cardiologist: None   Subjective   Had hypotension yesterday after being given his dose of beta-blocker and IV Amio bolus.  Currently heart rate 115 bpm at rest off IV Amio.  He is diuresing well and edema in his lower extremities has significantly improved.  He denies any shortness of breath.  Plan for heart cath today. Inpatient Medications    Scheduled Meds:  feeding supplement  237 mL Oral BID BM   furosemide  40 mg Intravenous BID   metoprolol tartrate  50 mg Oral BID   multivitamin with minerals  1 tablet Oral Daily   Continuous Infusions:  heparin 1,300 Units/hr (12/01/21 0218)   PRN Meds: acetaminophen, guaiFENesin-dextromethorphan, ondansetron (ZOFRAN) IV   Vital Signs    Vitals:   11/30/21 2355 12/01/21 0506 12/01/21 0900 12/01/21 1041  BP: 98/84 91/67 (!) 89/63 (!) 115/91  Pulse: (!) 108 (!) 113 (!) 120   Resp: 20 20 18    Temp: 98.2 F (36.8 C) 98.2 F (36.8 C) 97.9 F (36.6 C)   TempSrc: Oral Oral Oral   SpO2: 96% 94% 95%   Weight:  97.8 kg    Height:        Intake/Output Summary (Last 24 hours) at 12/01/2021 1111 Last data filed at 12/01/2021 [id number] Gross per 24 hour  Intake 942.43 ml  Output 5100 ml  Net -4157.57 ml      12/01/2021    5:06 AM 11/30/2021   10:02 AM 11/30/2021    4:28 AM  Last 3 Weights  Weight (lbs) 215 lb 8 oz 222 lb 14.2 oz 222 lb 12.8 oz  Weight (kg) 97.75 kg 101.1 kg 101.061 kg      Telemetry    Atrial fibrillation with RVR at 115 bpm at rest but shoots up into the 140s movement personally Reviewed  ECG    No new EKG to review- Personally Reviewed  Physical Exam   GEN: Well nourished, well developed in no acute distress HEENT: Normal NECK: No JVD; No carotid bruits LYMPHATICS: No lymphadenopathy CARDIAC: Irregularly irregular tacky, no murmurs, rubs, gallops RESPIRATORY:  Clear to auscultation without rales, wheezing or rhonchi   ABDOMEN: Soft, non-tender, non-distended MUSCULOSKELETAL: 1+ bilateral lower extremity edema; No deformity  SKIN: Warm and dry NEUROLOGIC:  Alert and oriented x 3 PSYCHIATRIC:  Normal affect    Labs    High Sensitivity Troponin:   Recent Labs  Lab 11/28/21 2126 11/28/21 2356 11/29/21 0535  TROPONINIHS 23* 52* 573*      Chemistry Recent Labs  Lab 11/29/21 0535 11/30/21 0215 12/01/21 0307  NA 141 134* 138  K 3.7 3.6 3.7  CL 105 103 100  CO2 26 23 27   GLUCOSE 109* 100* 100*  BUN 27* 25* 23*  CREATININE 1.86* 1.75* 1.69*  CALCIUM 8.7* 8.2* 8.2*  PROT 5.4* 5.5* 5.3*  ALBUMIN 3.1* 3.1* 2.9*  AST 51* 45* 34  ALT 60* 59* 50*  ALKPHOS 47 45 48  BILITOT 1.9* 1.2 1.3*  GFRNONAA 41* 44* 46*  ANIONGAP 10 8 11      Hematology Recent Labs  Lab 11/29/21 0535 11/30/21 0215 12/01/21 0307  WBC 9.7 9.6 9.6  RBC 4.99 4.95 4.95  HGB 14.8 14.4 14.4  HCT 44.1 43.6 44.0  MCV 88.4 88.1 88.9  MCH 29.7 29.1 29.1  MCHC 33.6 33.0 32.7  RDW 13.2 13.4 13.4  PLT 172 161 167    BNP  Recent Labs  Lab 11/28/21 2126  BNP 639.5*     DDimer No results for input(s): "DDIMER" in the last 168 hours.   CHA2DS2-VASc Score = 2   This indicates a 2.2% annual risk of stroke. The patient's score is based upon: CHF History: 1 HTN History: 1 Diabetes History: 0 Stroke History: 0 Vascular Disease History: 0 Age Score: 0 Gender Score: 0    Radiology    ECHOCARDIOGRAM COMPLETE  Result Date: 11/29/2021    ECHOCARDIOGRAM REPORT   Patient Name:   Adam Henson Date of Exam: 11/29/2021 Medical Rec #:  [id number]      Height:       71.0 in Accession #:    [id number]     Weight:       232.0 lb Date of Birth:  Aug 20, 1960     BSA:          2.246 m Patient Age:    61 years       BP:           100/84 mmHg Patient Gender: M              HR:           82 bpm. Exam Location:  Inpatient Procedure: 2D Echo, Cardiac Doppler and Color Doppler Indications:    Afib  History:        Patient has no prior  history of Echocardiogram examinations.  Sonographer:    Eartha Inch Referring Phys: [id number] [place]  1. Left ventricular ejection fraction, by estimation, is 25 to 30%. The left ventricle has severely decreased function. The left ventricle demonstrates global hypokinesis. The left ventricular internal cavity size was moderately dilated. Left ventricular diastolic parameters are indeterminate.  2. Right ventricular systolic function is moderately reduced. The right ventricular size is mildly enlarged. There is moderately elevated pulmonary artery systolic pressure.  3. Left atrial size was moderately dilated.  4. The mitral valve is abnormal. Mild to moderate mitral valve regurgitation. No evidence of mitral stenosis.  5. Tricuspid valve regurgitation is mild to moderate.  6. The aortic valve is tricuspid. Aortic valve regurgitation is trivial. No aortic stenosis is present.  7. The inferior vena cava is dilated in size with >50% respiratory variability, suggesting right atrial pressure of 8 mmHg. FINDINGS  Left Ventricle: Left ventricular ejection fraction, by estimation, is 25 to 30%. The left ventricle has severely decreased function. The left ventricle demonstrates global hypokinesis. The left ventricular internal cavity size was moderately dilated. There is no left ventricular hypertrophy. Left ventricular diastolic parameters are indeterminate. Right Ventricle: The right ventricular size is mildly enlarged. No increase in right ventricular wall thickness. Right ventricular systolic function is moderately reduced. There is moderately elevated pulmonary artery systolic pressure. The tricuspid regurgitant velocity is 2.94 m/s, and with an assumed right atrial pressure of 15 mmHg, the estimated right ventricular systolic pressure is [id number] mmHg. Left Atrium: Left atrial size was moderately dilated. Right Atrium: Right atrial size was normal in size. Pericardium: There is no evidence of  pericardial effusion. Mitral Valve: The mitral valve is abnormal. There is mild thickening of the mitral valve leaflet(s). There is mild calcification of the mitral valve leaflet(s). Mild to moderate mitral valve regurgitation. No evidence of mitral valve stenosis. Tricuspid Valve: The tricuspid valve is normal in structure. Tricuspid valve regurgitation is mild to moderate. No evidence of tricuspid stenosis. Aortic Valve: The aortic valve is tricuspid. Aortic valve regurgitation is trivial.  No aortic stenosis is present. Pulmonic Valve: The pulmonic valve was normal in structure. Pulmonic valve regurgitation is not visualized. No evidence of pulmonic stenosis. Aorta: The aortic root is normal in size and structure. Venous: The inferior vena cava is dilated in size with greater than 50% respiratory variability, suggesting right atrial pressure of 8 mmHg. IAS/Shunts: No atrial level shunt detected by color flow Doppler.  LEFT VENTRICLE PLAX 2D LVIDd:         6.10 cm LVIDs:         4.90 cm LV PW:         1.10 cm LV IVS:        1.10 cm LVOT diam:     2.40 cm LVOT Area:     4.52 cm  LV Volumes (MOD) LV vol d, MOD A2C: 194.0 ml LV vol d, MOD A4C: 195.0 ml LV vol s, MOD A2C: 118.0 ml LV vol s, MOD A4C: 129.0 ml LV SV MOD A2C:     76.0 ml LV SV MOD A4C:     195.0 ml LV SV MOD BP:      75.0 ml RIGHT VENTRICLE            IVC RV S prime:     8.76 cm/s  IVC diam: 2.90 cm TAPSE (M-mode): 1.1 cm LEFT ATRIUM              Index        RIGHT ATRIUM           Index LA diam:        4.50 cm  2.00 cm/m   RA Area:     25.70 cm LA Vol (A2C):   97.7 ml  43.50 ml/m  RA Volume:   88.60 ml  39.45 ml/m LA Vol (A4C):   88.0 ml  39.18 ml/m LA Biplane Vol: 102.0 ml 45.42 ml/m   AORTA Ao Root diam: 3.20 cm Ao Asc diam:  3.30 cm MR Peak grad: 57.2 mmHg   TRICUSPID VALVE MR Mean grad: 33.0 mmHg   TR Peak grad:   34.6 mmHg MR Vmax:      378.00 cm/s TR Vmax:        294.00 cm/s MR Vmean:     263.0 cm/s                           SHUNTS                            Systemic Diam: 2.40 cm Jenkins Rouge MD Electronically signed by Jenkins Rouge MD Signature Date/Time: 11/29/2021/4:21:27 PM    Final     Cardiac Studies   2D echo 11/29/2021 IMPRESSIONS    1. Left ventricular ejection fraction, by estimation, is 25 to 30%. The  left ventricle has severely decreased function. The left ventricle  demonstrates global hypokinesis. The left ventricular internal cavity size  was moderately dilated. Left  ventricular diastolic parameters are indeterminate.   2. Right ventricular systolic function is moderately reduced. The right  ventricular size is mildly enlarged. There is moderately elevated  pulmonary artery systolic pressure.   3. Left atrial size was moderately dilated.   4. The mitral valve is abnormal. Mild to moderate mitral valve  regurgitation. No evidence of mitral stenosis.   5. Tricuspid valve regurgitation is mild to moderate.   6. The aortic valve is tricuspid. Aortic valve regurgitation  is trivial.  No aortic stenosis is present.   7. The inferior vena cava is dilated in size with >50% respiratory  variability, suggesting right atrial pressure of 8 mmHg.  Patient Profile     61 y.o. male with a hx of HLD who is being seen 11/30/2021 for the evaluation of newly diagnosed atrial fibrillation with RVR and acute systolic heart failure at the request of Dr. Natale Milch.  Assessment & Plan    New onset atrial fibrillation with RVR -Duration unknown although likely a few weeks given patient's new onset shortness of breath and lower extremity edema -CHA2DS2-VASc score is 2 (CHF and HTN)  -Currently on IV heparin drip per pharmacy (will hold on starting DOAC as likely he will need right and left heart catheterization) -HR remains elevated at 115bpm at baseline and increases to 150's with movement -Systolic blood pressure dropped into the 80s while in the rapid A-fib at 169 bpm yesterday after Amio and BB which are now on hold -BP still  too soft for Amio or BB -K_+ 3.8 today -Mag was 2.2 on admission -will make NPO after MN for DCCV tomorrow -we are going to hold on right and [facility] at this time due to worsening renal function and need to get back in NSR.   -will change IV Heparin to Eliquis 5mg  BID   Dilated cardiomyopathy/elevated Troponin/acute systolic CHF -This is a new diagnosis for him with echo showing EF 25 to 30% with global hypokinesis -Suspect this is tachycardia mediated from his A-fib with RVR but he does have cardiac risk factors for CAD including remote history of tobacco use, hyperlipidemia -Troponin initially low but then bumped to over 500.  Suspect this is probably related to demand ischemia in the setting of A-fib with RVR and acute CHF but also need to consider possible underlying CAD -Continue IV heparin drip -stopped BB due to soft BP -No ACE/ARB/ARN I/MRA at this time given AKI -Hopefully if renal function improves can add GDMT with and Sherryll Burger  -hold SGLT2i for now due to AKI -He currently is on Lasix 40 mg IV twice daily and put out 1.8 L yesterday and is net -11.3L since admission -Weight decreased 9 pounds from admission -Serum creatinine was slowly improving with diuresis but bumped today (1.92 on admission>1.86> 1.75>1.69 >1.96 today) -suspect that his bump in SCr today was related to hypotension yesterday and not from overdiuresis as he remains markedly volume overloaded on exam.  -continue lasix 40mg  IV BID -Continue to follow strict I's and O's, daily weights, renal function while diuresing -I think he would benefit from right and left heart catheterization to assess filling pressures as well as define coronary anatomy but SCr bumped today so cannot do at this time.  His initial hsTrops were normal but bumped to 500 likely related to demand ischemia in setting of acute CHF and AKI.  His echo showed LV dysfunction but global HK and no focal RWMAs. He has not had any anginal sx .  At this  time I think it is more important to get back in NSR as we cannot get his HR controlled.  Can repeat echo in 4 weeks after restoring NSR and if EF remains down then plan ischemic workup.    AKI on CKD stage IIIa -Serum creatinine 1.92 on admission and  improved yesterday to 1.69 with diuresis but now up to 1.96 -His last serum creatinine I can find was 1.5 in 2015 and so therefore likely has had  a diagnosis of CKD stage IIIa but not on his medical history  -SCr likely bumped due to hypotension yesterday -Follow renal function closely with diuresis   Hyperlipidemia -LDL goal is less than 100 unless he is found to have CAD -Last LDL was 169 2015>>LDL 102 this admit -No statin for now given bump in liver enzymes   Transaminitis -Suspect related to hepatic congestion from acute CHF exacerbation>>slowly improving -We will continue to follow and hopefully will improve with diuresis  I have spent a total of 35 minutes with patient reviewing 2D echo, telemetry, EKGs, labs and examining patient as well as establishing an assessment and plan that was discussed with the patient.  > 50% of time was spent in direct patient care.        For questions or updates, please contact [facility] Please consult [email or web address] for contact info under        Signed, Armanda Magic, MD  12/01/2021, 11:11 AM

## 2021-12-01 NOTE — Progress Notes (Addendum)
ANTICOAGULATION CONSULT NOTE  Pharmacy Consult for heparin Indication: atrial fibrillation  No Known Allergies  Patient Measurements: Height: 5\' 11"  (180.3 cm) Weight: 97.8 kg (215 lb 8 oz) IBW/kg (Calculated) : 75.3 Heparin Dosing Weight: 96.2 kg  Vital Signs: Temp: 98.2 F (36.8 C) (06/18 0506) Temp Source: Oral (06/18 0506) BP: 91/67 (06/18 0506) Pulse Rate: 113 (06/18 0506)  Labs: Recent Labs    11/28/21 2126 11/28/21 2356 11/29/21 0535 11/30/21 0215 11/30/21 1807 12/01/21 0307  HGB 16.3  --  14.8 14.4  --  14.4  HCT 48.7  --  44.1 43.6  --  44.0  PLT 206  --  172 161  --  167  LABPROT  --   --  17.1*  --   --   --   INR  --   --  1.4*  --   --   --   HEPARINUNFRC  --   --   --   --  0.71* 0.40  CREATININE 1.92*  --  1.86* 1.75*  --  1.69*  TROPONINIHS 23* 52* 573*  --   --   --      Estimated Creatinine Clearance: 55.4 mL/min (A) (by C-G formula based on SCr of 1.69 mg/dL (H)).   Medical History: Past Medical History:  Diagnosis Date   Hyperlipidemia     Medications:  Medications Prior to Admission  Medication Sig Dispense Refill Last Dose   BLACK CURRANT SEED OIL PO Take 1 capsule by mouth daily.   11/28/2021   ELDERBERRY PO Take 1 capsule by mouth daily.   11/28/2021   polyethylene glycol (MIRALAX / GLYCOLAX) 17 g packet Take 17 g by mouth daily as needed for mild constipation or moderate constipation.   11/28/2021   senna (SENOKOT) 8.6 MG tablet Take 1 tablet by mouth daily as needed for constipation.   11/28/2021   naproxen (NAPROSYN) 500 MG tablet Take 1 tablet (500 mg total) by mouth 2 (two) times daily with a meal. (Patient not taking: Reported on 11/28/2021) 30 tablet 0 Not Taking   predniSONE (DELTASONE) 20 MG tablet Take 2 pills a day for 3 days, then 1 pill a day for 3 days (Patient not taking: Reported on 11/28/2021) 9 tablet 0 Not Taking   tiZANidine (ZANAFLEX) 4 MG tablet Take 1 tablet (4 mg total) by mouth every 8 (eight) hours as needed.  (Patient not taking: Reported on 11/28/2021) 30 tablet 0 Not Taking   Scheduled:   feeding supplement  237 mL Oral BID BM   furosemide  40 mg Intravenous BID   metoprolol tartrate  50 mg Oral BID   multivitamin with minerals  1 tablet Oral Daily   Infusions:   heparin 1,300 Units/hr (12/01/21 0218)    Assessment: Pt is a 61 yo male who presents with new onset afib and heart failure. Pharmacy consutled to dose heparin. CBC WNL. Patient is not on anticoagulation PTA.   Initial heparin level is therapeutic at 0.4. No infusion or bleeding issues noted.  Goal of Therapy:  Heparin level 0.3-0.7 units/ml Monitor platelets by anticoagulation protocol: Yes    Plan:  Continue heparin infusion at 1300 units/hr Confirmatory anti-Xa level in 6-8 hours and daily while on heparin Continue to monitor H&H and platelets  Thank you for allowing pharmacy to participate in this patient's care.  67, PharmD PGY1 Pharmacy Resident 12/01/2021 7:17 AM Check [email or web address] for unit specific pharmacy number   ADDENDUM:  Afternoon heparin level therapeutic at  0.44. Will follow up plans for coag after [facility] tomorrow.  Continue heparin at 1300 units/hr and check daily HL

## 2021-12-01 NOTE — Progress Notes (Signed)
HR up to 160s. BP 89/63. Notified PA.  Sabra Heck, RN

## 2021-12-01 NOTE — Progress Notes (Signed)
Called to patient's room for low blood pressures.  He received a 150 mg IV bolus of amiodarone and was subsequently started on amiodarone infusion.  About 30 minutes into the infusion he became hypotensive and lightheaded.  The amiodarone was held.  He is currently sitting in the chair upright and eating.  He feels a little odd but otherwise stable.  He was encouraged to drink.  We will give a dose of midodrine 5 mg.  Once his systolic blood pressures in the 110s, recommend resuming the amiodarone infusion without a bolus.  Geddy Boydstun C. Duke Salvia, MD, [facility] 12/01/2021 12:46 PM

## 2021-12-01 NOTE — Progress Notes (Signed)
  Amiodarone Drug - Drug Interaction Consult Note  Recommendations: Supplement potassium to maintain K>4 while diuresing   Amiodarone is metabolized by the cytochrome P450 system and therefore has the potential to cause many drug interactions. Amiodarone has an average plasma half-life of 50 days (range 20 to 100 days).   There is potential for drug interactions to occur several weeks or months after stopping treatment and the onset of drug interactions may be slow after initiating amiodarone.   []  Statins: Increased risk of myopathy. Simvastatin- restrict dose to 20mg  daily. Other statins: counsel patients to report any muscle pain or weakness immediately.  []  Anticoagulants: Amiodarone can increase anticoagulant effect. Consider warfarin dose reduction. Patients should be monitored closely and the dose of anticoagulant altered accordingly, remembering that amiodarone levels take several weeks to stabilize.  []  Antiepileptics: Amiodarone can increase plasma concentration of phenytoin, the dose should be reduced. Note that small changes in phenytoin dose can result in large changes in levels. Monitor patient and counsel on signs of toxicity.  []  Beta blockers: increased risk of bradycardia, AV block and myocardial depression. Sotalol - avoid concomitant use.  []   Calcium channel blockers (diltiazem and verapamil): increased risk of bradycardia, AV block and myocardial depression.  []   Cyclosporine: Amiodarone increases levels of cyclosporine. Reduced dose of cyclosporine is recommended.  []  Digoxin dose should be halved when amiodarone is started.  [x]  Diuretics: increased risk of cardiotoxicity if hypokalemia occurs.  []  Oral hypoglycemic agents (glyburide, glipizide, glimepiride): increased risk of hypoglycemia. Patient's glucose levels should be monitored closely when initiating amiodarone therapy.   []  Drugs that prolong the QT interval:  Torsades de pointes risk may be increased with  concurrent use - avoid if possible.  Monitor QTc, also keep magnesium/potassium WNL if concurrent therapy can't be avoided.  Antibiotics: e.g. fluoroquinolones, erythromycin.  Antiarrhythmics: e.g. quinidine, procainamide, disopyramide, sotalol.  Antipsychotics: e.g. phenothiazines, haloperidol.   Lithium, tricyclic antidepressants, and methadone. Thank You,   12/01/2021 1:37 PM

## 2021-12-01 NOTE — Progress Notes (Signed)
Cath orders written per Dr. Norris Cross recommendation, will put on add-on board for tomorrow as requested.

## 2021-12-01 NOTE — Care Management (Signed)
Unable to complete benefit check on the weekend. Printed out  [place] 30 day voucher and copay reduction card from website and placed on chart. Commercial insurance copay may be reduced up to full cost.

## 2021-12-01 NOTE — Progress Notes (Addendum)
Progress Note  Patient Name: Adam Henson Date of Encounter: 12/01/2021  [facility] Cardiologist: None   Subjective   Responding nicely to diuresis with improving renal function.  Heart rate went up into the 160s to 170s with ambulation washing up today in the room.  Currently in the 130-140s fibrillation.  Systolic blood pressure decreased to the 80s during the rapid heart rate.  Patient feels fine right now.  Inpatient Medications    Scheduled Meds:  feeding supplement  237 mL Oral BID BM   furosemide  40 mg Intravenous BID   metoprolol tartrate  50 mg Oral BID   multivitamin with minerals  1 tablet Oral Daily   Continuous Infusions:  heparin 1,300 Units/hr (12/01/21 0218)   PRN Meds: acetaminophen, guaiFENesin-dextromethorphan, ondansetron (ZOFRAN) IV   Vital Signs    Vitals:   11/30/21 2355 12/01/21 0506 12/01/21 0900 12/01/21 1041  BP: 98/84 91/67 (!) 89/63 (!) 115/91  Pulse: (!) 108 (!) 113 (!) 120   Resp: 20 20 18    Temp: 98.2 F (36.8 C) 98.2 F (36.8 C) 97.9 F (36.6 C)   TempSrc: Oral Oral Oral   SpO2: 96% 94% 95%   Weight:  97.8 kg    Height:        Intake/Output Summary (Last 24 hours) at 12/01/2021 1111 Last data filed at 12/01/2021 0819 Gross per 24 hour  Intake 942.43 ml  Output 5100 ml  Net -4157.57 ml      12/01/2021    5:06 AM 11/30/2021   10:02 AM 11/30/2021    4:28 AM  Last 3 Weights  Weight (lbs) 215 lb 8 oz 222 lb 14.2 oz 222 lb 12.8 oz  Weight (kg) 97.75 kg 101.1 kg 101.061 kg      Telemetry    Atrial fibrillation with RVR as high as 169 bpm personally Reviewed  ECG    No new EKG to review- Personally Reviewed  Physical Exam   GEN: No acute distress.   Neck: No JVD Cardiac: Irregularly irregular and tachycardic, no murmurs, rubs, or gallops.  Respiratory: Clear to auscultation bilaterally. GI: Soft, nontender, non-distended  MS: 2+ pitting bilateral lower extremity edema; No deformity. Neuro:  Nonfocal  Psych:  Normal affect   Labs    High Sensitivity Troponin:   Recent Labs  Lab 11/28/21 2126 11/28/21 2356 11/29/21 0535  TROPONINIHS 23* 52* 573*      Chemistry Recent Labs  Lab 11/29/21 0535 11/30/21 0215 12/01/21 0307  NA 141 134* 138  K 3.7 3.6 3.7  CL 105 103 100  CO2 26 23 27   GLUCOSE 109* 100* 100*  BUN 27* 25* 23*  CREATININE 1.86* 1.75* 1.69*  CALCIUM 8.7* 8.2* 8.2*  PROT 5.4* 5.5* 5.3*  ALBUMIN 3.1* 3.1* 2.9*  AST 51* 45* 34  ALT 60* 59* 50*  ALKPHOS 47 45 48  BILITOT 1.9* 1.2 1.3*  GFRNONAA 41* 44* 46*  ANIONGAP 10 8 11      Hematology Recent Labs  Lab 11/29/21 0535 11/30/21 0215 12/01/21 0307  WBC 9.7 9.6 9.6  RBC 4.99 4.95 4.95  HGB 14.8 14.4 14.4  HCT 44.1 43.6 44.0  MCV 88.4 88.1 88.9  MCH 29.7 29.1 29.1  MCHC 33.6 33.0 32.7  RDW 13.2 13.4 13.4  PLT 172 161 167    BNP Recent Labs  Lab 11/28/21 2126  BNP 639.5*     DDimer No results for input(s): "DDIMER" in the last 168 hours.   CHA2DS2-VASc Score =  2   This indicates a 2.2% annual risk of stroke. The patient's score is based upon: CHF History: 1 HTN History: 1 Diabetes History: 0 Stroke History: 0 Vascular Disease History: 0 Age Score: 0 Gender Score: 0    Radiology    ECHOCARDIOGRAM COMPLETE  Result Date: 11/29/2021    ECHOCARDIOGRAM REPORT   Patient Name:   Adam Henson Date of Exam: 11/29/2021 Medical Rec #:  [id number]      Height:       71.0 in Accession #:    [id number]     Weight:       232.0 lb Date of Birth:  10/21/1960     BSA:          2.246 m Patient Age:    60 years       BP:           100/84 mmHg Patient Gender: M              HR:           82 bpm. Exam Location:  Inpatient Procedure: 2D Echo, Cardiac Doppler and Color Doppler Indications:    Afib  History:        Patient has no prior history of Echocardiogram examinations.  Sonographer:    Milda Smart Referring Phys: [id number] TIMOTHY S OPYD IMPRESSIONS  1. Left ventricular ejection fraction, by estimation, is 25  to 30%. The left ventricle has severely decreased function. The left ventricle demonstrates global hypokinesis. The left ventricular internal cavity size was moderately dilated. Left ventricular diastolic parameters are indeterminate.  2. Right ventricular systolic function is moderately reduced. The right ventricular size is mildly enlarged. There is moderately elevated pulmonary artery systolic pressure.  3. Left atrial size was moderately dilated.  4. The mitral valve is abnormal. Mild to moderate mitral valve regurgitation. No evidence of mitral stenosis.  5. Tricuspid valve regurgitation is mild to moderate.  6. The aortic valve is tricuspid. Aortic valve regurgitation is trivial. No aortic stenosis is present.  7. The inferior vena cava is dilated in size with >50% respiratory variability, suggesting right atrial pressure of 8 mmHg. FINDINGS  Left Ventricle: Left ventricular ejection fraction, by estimation, is 25 to 30%. The left ventricle has severely decreased function. The left ventricle demonstrates global hypokinesis. The left ventricular internal cavity size was moderately dilated. There is no left ventricular hypertrophy. Left ventricular diastolic parameters are indeterminate. Right Ventricle: The right ventricular size is mildly enlarged. No increase in right ventricular wall thickness. Right ventricular systolic function is moderately reduced. There is moderately elevated pulmonary artery systolic pressure. The tricuspid regurgitant velocity is 2.94 m/s, and with an assumed right atrial pressure of 15 mmHg, the estimated right ventricular systolic pressure is 49.6 mmHg. Left Atrium: Left atrial size was moderately dilated. Right Atrium: Right atrial size was normal in size. Pericardium: There is no evidence of pericardial effusion. Mitral Valve: The mitral valve is abnormal. There is mild thickening of the mitral valve leaflet(s). There is mild calcification of the mitral valve leaflet(s). Mild to  moderate mitral valve regurgitation. No evidence of mitral valve stenosis. Tricuspid Valve: The tricuspid valve is normal in structure. Tricuspid valve regurgitation is mild to moderate. No evidence of tricuspid stenosis. Aortic Valve: The aortic valve is tricuspid. Aortic valve regurgitation is trivial. No aortic stenosis is present. Pulmonic Valve: The pulmonic valve was normal in structure. Pulmonic valve regurgitation is not visualized. No evidence of pulmonic stenosis. Aorta: The aortic root  is normal in size and structure. Venous: The inferior vena cava is dilated in size with greater than 50% respiratory variability, suggesting right atrial pressure of 8 mmHg. IAS/Shunts: No atrial level shunt detected by color flow Doppler.  LEFT VENTRICLE PLAX 2D LVIDd:         6.10 cm LVIDs:         4.90 cm LV PW:         1.10 cm LV IVS:        1.10 cm LVOT diam:     2.40 cm LVOT Area:     4.52 cm  LV Volumes (MOD) LV vol d, MOD A2C: 194.0 ml LV vol d, MOD A4C: 195.0 ml LV vol s, MOD A2C: 118.0 ml LV vol s, MOD A4C: 129.0 ml LV SV MOD A2C:     76.0 ml LV SV MOD A4C:     195.0 ml LV SV MOD BP:      75.0 ml RIGHT VENTRICLE            IVC RV S prime:     8.76 cm/s  IVC diam: 2.90 cm TAPSE (M-mode): 1.1 cm LEFT ATRIUM              Index        RIGHT ATRIUM           Index LA diam:        4.50 cm  2.00 cm/m   RA Area:     25.70 cm LA Vol (A2C):   97.7 ml  43.50 ml/m  RA Volume:   88.60 ml  39.45 ml/m LA Vol (A4C):   88.0 ml  39.18 ml/m LA Biplane Vol: 102.0 ml 45.42 ml/m   AORTA Ao Root diam: 3.20 cm Ao Asc diam:  3.30 cm MR Peak grad: 57.2 mmHg   TRICUSPID VALVE MR Mean grad: 33.0 mmHg   TR Peak grad:   34.6 mmHg MR Vmax:      378.00 cm/s TR Vmax:        294.00 cm/s MR Vmean:     263.0 cm/s                           SHUNTS                           Systemic Diam: 2.40 cm Charlton Haws MD Electronically signed by Charlton Haws MD Signature Date/Time: 11/29/2021/4:21:27 PM    Final     Cardiac Studies   2D echo  11/29/2021 IMPRESSIONS    1. Left ventricular ejection fraction, by estimation, is 25 to 30%. The  left ventricle has severely decreased function. The left ventricle  demonstrates global hypokinesis. The left ventricular internal cavity size  was moderately dilated. Left  ventricular diastolic parameters are indeterminate.   2. Right ventricular systolic function is moderately reduced. The right  ventricular size is mildly enlarged. There is moderately elevated  pulmonary artery systolic pressure.   3. Left atrial size was moderately dilated.   4. The mitral valve is abnormal. Mild to moderate mitral valve  regurgitation. No evidence of mitral stenosis.   5. Tricuspid valve regurgitation is mild to moderate.   6. The aortic valve is tricuspid. Aortic valve regurgitation is trivial.  No aortic stenosis is present.   7. The inferior vena cava is dilated in size with >50% respiratory  variability, suggesting right atrial pressure of  8 mmHg.  Patient Profile     61 y.o. male with a hx of HLD who is being seen 11/30/2021 for the evaluation of newly diagnosed atrial fibrillation with RVR and acute systolic heart failure at the request of Dr. Natale Milch.  Assessment & Plan    New onset atrial fibrillation with RVR -Duration unknown although likely a few weeks given patient's new onset shortness of breath and lower extremity edema -CHA2DS2-VASc score is 2 (CHF and HTN)  -Currently on IV heparin drip per pharmacy (will hold on starting DOAC as likely he will need right and left heart catheterization) -Heart rate still poorly controlled 130s and goes as high as the 160s with movement. -Systolic blood pressure dropped into the 80s while in the rapid A-fib at 169 bpm.  BP now 115/91 mmHg and he was given his a.m. dose of Lopressor 25 mg -Continue Lopressor 25 mg twice daily>> cannot increase beta-blocker further given intermittent soft blood pressure -Start IV Amio drip with 150 mg bolus to try to  get the heart rate better controlled -Replete potassium to keep greater than 4 (potassium 3.6 this morning).  Will give K-Dur 40 mEq now -Mag was 2.2 on admission -Bmet in a.m.   Dilated cardiomyopathy/elevated Troponin/acute systolic CHF -This is a new diagnosis for him with echo showing EF 25 to 30% with global hypokinesis -Suspect this is tachycardia mediated from his A-fib with RVR but he does have cardiac risk factors for CAD including remote history of tobacco use, hyperlipidemia -Troponin initially low but then bumped to over 500.  Suspect this is probably related to demand ischemia in the setting of A-fib with RVR and acute CHF but also need to consider possible underlying CAD -Continue IV heparin drip -I think he would benefit from right and left heart catheterization to assess filling pressures as well as define coronary anatomy -Lopressor 25 mg twice daily-cannot increase further due to soft BP at times when heart rate is elevated -No ACE/ARB/ARN I/MRA at this time given AKI -Hopefully if renal function improves can add GDMT with Sherryll Burger and Cleda Daub post cath -will get case management consult for cost of SGLT2i -He currently is on Lasix 40 mg IV twice daily and put out 5.6 L yesterday and is net -9.9 L since admission -Weight decreased 7 pounds from admission -Serum creatinine slowly improving with diuresis (1.92 on admission>1.86> 1.75>1.69 today) -Continue Lasix 40 mg IV twice daily -Continue to follow strict I's and O's, daily weights, renal function while diuresing -Make n.p.o. after midnight for possible right and left heart cath tomorrow (cors only with no LVgram) if renal function improves further -Shared Decision Making/Informed Consent The risks [stroke (1 in 1000), death (1 in 1000), kidney failure [usually temporary] (1 in 500), bleeding (1 in 200), allergic reaction [possibly serious] (1 in 200)], benefits (diagnostic support and management of coronary artery disease) and  alternatives of a cardiac catheterization were discussed in detail with Mr. Gillingham and he is willing to proceed.    AKI on CKD stage IIIa -Serum creatinine 1.92 on admission and is improved to 1.69 today with diuresis  -His last serum creatinine I can find was 1.5 in 2015 and so therefore likely has had a diagnosis of CKD stage IIIa but not on his medical history  -Follow renal function closely with diuresis   Hyperlipidemia -LDL goal is less than 100 unless he is found to have CAD -Last LDL was 169 2015 -Check FLP tomorrow -No statin for now given bump  in liver enzymes   Transaminitis -Suspect related to hepatic congestion from acute CHF exacerbation -We will continue to follow and hopefully will improve with diuresis  I have spent a total of 35 minutes with patient reviewing 2D echo, telemetry, EKGs, labs and examining patient as well as establishing an assessment and plan that was discussed with the patient.  > 50% of time was spent in direct patient care.        For questions or updates, please contact [place] Please consult [email or web address] for contact info under        Signed, Fransico Him, MD  12/01/2021, 11:11 AM

## 2021-12-01 NOTE — Progress Notes (Signed)
BP 69/58 after being on amio drip for 30 minutes. Paused amio. Notified PA. Ordered 5mg  midodrine.  , RN

## 2021-12-01 NOTE — Progress Notes (Signed)
PROGRESS NOTE    Adam Henson  GBT:[id number] DOB: Nov 19, 1960 DOA: 11/28/2021 PCP: Pcp, No   Brief Narrative:   Adam Henson is a pleasant 61 y.o. male who denies any significant past medical history though has not seen a physician in a few years and now presents with 3 weeks of bilateral lower extremity swelling, shortness of breath, orthopnea, and palpitations.  Assessment & Plan:   Principal Problem:   Atrial fibrillation with RVR (HCC) Active Problems:   Acute CHF (HCC)   Elevated troponin   Elevated LFTs   Stage 3b chronic kidney disease (CKD) (HCC)  New-onset atrial fibrillation with RVR, improving -Likely provoked by heart failure exacerbation, possible underlying other cardiac etiology -Cardiology consulted, appreciate insight and recommendations -Initially on Cardizem drip transition to low-dose metoprolol, continues to be somewhat tachycardic cardiology transitioning to amiodarone -CHA2DS2-VASc of 1 (HF) -currently on heparin drip   New onset systolic heart failure, acute exacerbation -Cardiology consulted, appreciate insight and recommendations -Pending further work-up and evaluation with cardiology -likely pending right and left heart cath -Continue diuretics IV Lasix twice daily  -Patient may benefit from Entresto/ARB/ACE pending further evaluation  Elevated troponin  -Minimally elevated but increasing overnight currently 500  Elevated LFTs  -Again likely secondary to demand ischemia in setting of RVR and hypervolemia/congestive hepatopathy -Hepatitis HIV unremarkable INR minimally elevated at 1.4  AKI versus previously undocumented CKD -Distant history creatinine 1.5 -1.9 at intake downtrending appropriately -Follow I's and O's, avoid IV fluids in the setting of above    DVT prophylaxis: Lovenox  Code Status: Full  Family Communication: None present  Status is: Inpatient  Dispo: The patient is from: Home              Anticipated d/c is to: Home               Anticipated d/c date is: 48 to 72 hours              Patient currently not medically stable for discharge  Consultants:  None  Procedures:  None  Antimicrobials:  None  Subjective: No acute issues or events overnight, remains tachycardic today despite increased metoprolol dosing, transition amiodarone.  Patient understands the need for ongoing evaluation and treatment, likely need for cardiac testing with heart cath later this week.  Denies nausea vomiting diarrhea constipation headache fevers chills chest pain shortness of breath.  Objective: Vitals:   11/30/21 1955 11/30/21 2200 11/30/21 2355 12/01/21 0506  BP: 101/76 99/70 98/84  91/67  Pulse: (!) 131  (!) 108 (!) 113  Resp: 20  20 20   Temp: 98.2 F (36.8 C)  98.2 F (36.8 C) 98.2 F (36.8 C)  TempSrc: Oral  Oral Oral  SpO2: 95% 95% 96% 94%  Weight:    97.8 kg  Height:        Intake/Output Summary (Last 24 hours) at 12/01/2021 0737 Last data filed at 12/01/2021 0506 Gross per 24 hour  Intake 1142.43 ml  Output 5650 ml  Net -4507.57 ml    Filed Weights   11/30/21 0428 11/30/21 1002 12/01/21 0506  Weight: 101.1 kg 101.1 kg 97.8 kg    Examination:  General:  Pleasantly resting in bed, No acute distress. HEENT:  Normocephalic atraumatic.  Sclerae nonicteric, noninjected.  Extraocular movements intact bilaterally. Neck:  Without mass or deformity.  Trachea is midline. Lungs:  Clear to auscultate bilaterally without rhonchi, wheeze, or rales. Heart: Irregularly irregular; tachycardic into the 120s today- without murmurs, rubs, or gallops.  Abdomen:  Soft, nontender, nondistended.  Without guarding or rebound. Extremities: Without cyanosis, clubbing, edema, or obvious deformity. Vascular:  Dorsalis pedis and posterior tibial pulses palpable bilaterally. Skin:  Warm and dry, no erythema, no ulcerations.   Data Reviewed: I have personally reviewed following labs and imaging studies  CBC: Recent Labs  Lab  11/28/21 2126 11/29/21 0535 11/30/21 0215 12/01/21 0307  WBC 11.0* 9.7 9.6 9.6  NEUTROABS 7.0  --   --   --   HGB 16.3 14.8 14.4 14.4  HCT 48.7 44.1 43.6 44.0  MCV 90.2 88.4 88.1 88.9  PLT 206 172 161 [id number]    Basic Metabolic Panel: Recent Labs  Lab 11/28/21 2126 11/29/21 0535 11/30/21 0215 12/01/21 0307  NA 140 141 134* 138  K 5.1 3.7 3.6 3.7  CL 105 105 103 100  CO2 25 26 23 27   GLUCOSE 100* 109* 100* 100*  BUN 27* 27* 25* 23*  CREATININE 1.92* 1.86* 1.75* 1.69*  CALCIUM 9.0 8.7* 8.2* 8.2*  MG 2.2  --   --   --     GFR: Estimated Creatinine Clearance: 55.4 mL/min (A) (by C-G formula based on SCr of 1.69 mg/dL (H)). Liver Function Tests: Recent Labs  Lab 11/28/21 2126 11/29/21 0535 11/30/21 0215 12/01/21 0307  AST 65* 51* 45* 34  ALT 67* 60* 59* 50*  ALKPHOS 50 47 45 48  BILITOT 3.2* 1.9* 1.2 1.3*  PROT 6.0* 5.4* 5.5* 5.3*  ALBUMIN 3.6 3.1* 3.1* 2.9*    No results for input(s): "LIPASE", "AMYLASE" in the last 168 hours. No results for input(s): "AMMONIA" in the last 168 hours. Coagulation Profile: Recent Labs  Lab 11/29/21 0535  INR 1.4*    Cardiac Enzymes: No results for input(s): "CKTOTAL", "CKMB", "CKMBINDEX", "TROPONINI" in the last 168 hours. BNP (last 3 results) No results for input(s): "PROBNP" in the last 8760 hours. HbA1C: No results for input(s): "HGBA1C" in the last 72 hours. CBG: No results for input(s): "GLUCAP" in the last 168 hours. Lipid Profile: No results for input(s): "CHOL", "HDL", "LDLCALC", "TRIG", "CHOLHDL", "LDLDIRECT" in the last 72 hours. Thyroid Function Tests: Recent Labs    11/28/21 2356  TSH 3.081    Anemia Panel: No results for input(s): "VITAMINB12", "FOLATE", "FERRITIN", "TIBC", "IRON", "RETICCTPCT" in the last 72 hours. Sepsis Labs: No results for input(s): "PROCALCITON", "LATICACIDVEN" in the last 168 hours.  No results found for this or any previous visit (from the past 240 hour(s)).        Radiology Studies: ECHOCARDIOGRAM COMPLETE  Result Date: 11/29/2021    ECHOCARDIOGRAM REPORT   Patient Name:   Adam Henson Date of Exam: 11/29/2021 Medical Rec #:  [id number]      Height:       71.0 in Accession #:    [id number]     Weight:       232.0 lb Date of Birth:  [birth year]     BSA:          2.246 m Patient Age:    76 years       BP:           100/84 mmHg Patient Gender: M              HR:           82 bpm. Exam Location:  Inpatient Procedure: 2D Echo, Cardiac Doppler and Color Doppler Indications:    Afib  History:        Patient has no prior  history of Echocardiogram examinations.  Sonographer:    Milda Smart Referring Phys: [id number] TIMOTHY S OPYD IMPRESSIONS  1. Left ventricular ejection fraction, by estimation, is 25 to 30%. The left ventricle has severely decreased function. The left ventricle demonstrates global hypokinesis. The left ventricular internal cavity size was moderately dilated. Left ventricular diastolic parameters are indeterminate.  2. Right ventricular systolic function is moderately reduced. The right ventricular size is mildly enlarged. There is moderately elevated pulmonary artery systolic pressure.  3. Left atrial size was moderately dilated.  4. The mitral valve is abnormal. Mild to moderate mitral valve regurgitation. No evidence of mitral stenosis.  5. Tricuspid valve regurgitation is mild to moderate.  6. The aortic valve is tricuspid. Aortic valve regurgitation is trivial. No aortic stenosis is present.  7. The inferior vena cava is dilated in size with >50% respiratory variability, suggesting right atrial pressure of 8 mmHg. FINDINGS  Left Ventricle: Left ventricular ejection fraction, by estimation, is 25 to 30%. The left ventricle has severely decreased function. The left ventricle demonstrates global hypokinesis. The left ventricular internal cavity size was moderately dilated. There is no left ventricular hypertrophy. Left ventricular diastolic  parameters are indeterminate. Right Ventricle: The right ventricular size is mildly enlarged. No increase in right ventricular wall thickness. Right ventricular systolic function is moderately reduced. There is moderately elevated pulmonary artery systolic pressure. The tricuspid regurgitant velocity is 2.94 m/s, and with an assumed right atrial pressure of 15 mmHg, the estimated right ventricular systolic pressure is 49.6 mmHg. Left Atrium: Left atrial size was moderately dilated. Right Atrium: Right atrial size was normal in size. Pericardium: There is no evidence of pericardial effusion. Mitral Valve: The mitral valve is abnormal. There is mild thickening of the mitral valve leaflet(s). There is mild calcification of the mitral valve leaflet(s). Mild to moderate mitral valve regurgitation. No evidence of mitral valve stenosis. Tricuspid Valve: The tricuspid valve is normal in structure. Tricuspid valve regurgitation is mild to moderate. No evidence of tricuspid stenosis. Aortic Valve: The aortic valve is tricuspid. Aortic valve regurgitation is trivial. No aortic stenosis is present. Pulmonic Valve: The pulmonic valve was normal in structure. Pulmonic valve regurgitation is not visualized. No evidence of pulmonic stenosis. Aorta: The aortic root is normal in size and structure. Venous: The inferior vena cava is dilated in size with greater than 50% respiratory variability, suggesting right atrial pressure of 8 mmHg. IAS/Shunts: No atrial level shunt detected by color flow Doppler.  LEFT VENTRICLE PLAX 2D LVIDd:         6.10 cm LVIDs:         4.90 cm LV PW:         1.10 cm LV IVS:        1.10 cm LVOT diam:     2.40 cm LVOT Area:     4.52 cm  LV Volumes (MOD) LV vol d, MOD A2C: 194.0 ml LV vol d, MOD A4C: 195.0 ml LV vol s, MOD A2C: 118.0 ml LV vol s, MOD A4C: 129.0 ml LV SV MOD A2C:     76.0 ml LV SV MOD A4C:     195.0 ml LV SV MOD BP:      75.0 ml RIGHT VENTRICLE            IVC RV S prime:     8.76 cm/s  IVC  diam: 2.90 cm TAPSE (M-mode): 1.1 cm LEFT ATRIUM  Index        RIGHT ATRIUM           Index LA diam:        4.50 cm  2.00 cm/m   RA Area:     25.70 cm LA Vol (A2C):   97.7 ml  43.50 ml/m  RA Volume:   88.60 ml  39.45 ml/m LA Vol (A4C):   88.0 ml  39.18 ml/m LA Biplane Vol: 102.0 ml 45.42 ml/m   AORTA Ao Root diam: 3.20 cm Ao Asc diam:  3.30 cm MR Peak grad: 57.2 mmHg   TRICUSPID VALVE MR Mean grad: 33.0 mmHg   TR Peak grad:   34.6 mmHg MR Vmax:      378.00 cm/s TR Vmax:        294.00 cm/s MR Vmean:     263.0 cm/s                           SHUNTS                           Systemic Diam: 2.40 cm Jenkins Rouge MD Electronically signed by Jenkins Rouge MD Signature Date/Time: 11/29/2021/4:21:27 PM    Final     Scheduled Meds:  feeding supplement  237 mL Oral BID BM   furosemide  40 mg Intravenous BID   metoprolol tartrate  50 mg Oral BID   multivitamin with minerals  1 tablet Oral Daily   Continuous Infusions:  heparin 1,300 Units/hr (12/01/21 0218)     LOS: 3 days   Time spent: 43min  Tamme Mozingo C Acxel Dingee, DO [facility]  If 7PM-7AM, please contact night-coverage [email or web address]  12/01/2021, 7:37 AM

## 2021-12-02 ENCOUNTER — Encounter (HOSPITAL_COMMUNITY)
Admission: EM | Disposition: A | Discharge status: Home / Self Care | Payer: Self-pay | Source: Home / Self Care | Attending: Internal Medicine

## 2021-12-02 ENCOUNTER — Other Ambulatory Visit (HOSPITAL_COMMUNITY): Payer: Self-pay

## 2021-12-02 DIAGNOSIS — N179 Acute kidney failure, unspecified: Secondary | ICD-10-CM | POA: Insufficient documentation

## 2021-12-02 DIAGNOSIS — N1832 Chronic kidney disease, stage 3b: Secondary | ICD-10-CM | POA: Diagnosis not present

## 2021-12-02 DIAGNOSIS — R7989 Other specified abnormal findings of blood chemistry: Secondary | ICD-10-CM | POA: Diagnosis not present

## 2021-12-02 DIAGNOSIS — R778 Other specified abnormalities of plasma proteins: Secondary | ICD-10-CM | POA: Diagnosis not present

## 2021-12-02 DIAGNOSIS — I4891 Unspecified atrial fibrillation: Secondary | ICD-10-CM | POA: Diagnosis not present

## 2021-12-02 DIAGNOSIS — I5021 Acute systolic (congestive) heart failure: Secondary | ICD-10-CM

## 2021-12-02 DIAGNOSIS — I420 Dilated cardiomyopathy: Secondary | ICD-10-CM | POA: Insufficient documentation

## 2021-12-02 LAB — CBC
HCT: 44.7 % (ref 39.0–52.0)
Hemoglobin: 15.3 g/dL (ref 13.0–17.0)
MCH: 29.9 pg (ref 26.0–34.0)
MCHC: 34.2 g/dL (ref 30.0–36.0)
MCV: 87.5 fL (ref 80.0–100.0)
Platelets: 171 10*3/uL (ref 150–400)
RBC: 5.11 MIL/uL (ref 4.22–5.81)
RDW: 13.4 % (ref 11.5–15.5)
WBC: 9.4 10*3/uL (ref 4.0–10.5)
nRBC: 0 % (ref 0.0–0.2)

## 2021-12-02 LAB — BASIC METABOLIC PANEL
Anion gap: 10 (ref 5–15)
BUN: 23 mg/dL — ABNORMAL HIGH (ref 6–20)
CO2: 29 mmol/L (ref 22–32)
Calcium: 8.6 mg/dL — ABNORMAL LOW (ref 8.9–10.3)
Chloride: 100 mmol/L (ref 98–111)
Creatinine, Ser: 1.96 mg/dL — ABNORMAL HIGH (ref 0.61–1.24)
GFR, Estimated: 38 mL/min — ABNORMAL LOW (ref >60)
Glucose, Bld: 95 mg/dL (ref 70–99)
Potassium: 3.8 mmol/L (ref 3.5–5.1)
Sodium: 139 mmol/L (ref 135–145)

## 2021-12-02 LAB — HEPARIN LEVEL (UNFRACTIONATED): Heparin Unfractionated: 0.31 IU/mL (ref 0.30–0.70)

## 2021-12-02 LAB — LIPID PANEL
Cholesterol: 152 mg/dL (ref 0–200)
HDL: 38 mg/dL — ABNORMAL LOW (ref >40)
LDL Cholesterol: 102 mg/dL — ABNORMAL HIGH (ref 0–99)
Total CHOL/HDL Ratio: 4 RATIO
Triglycerides: 59 mg/dL (ref <150)
VLDL: 12 mg/dL (ref 0–40)

## 2021-12-02 SURGERY — RIGHT/LEFT HEART CATH AND CORONARY ANGIOGRAPHY
Anesthesia: LOCAL

## 2021-12-02 MED ORDER — SODIUM CHLORIDE 0.9% FLUSH: 3.0000 mL | INTRAVENOUS | Status: DC | PRN

## 2021-12-02 MED ORDER — SODIUM CHLORIDE 0.9 % IV SOLN: INTRAVENOUS | Status: DC

## 2021-12-02 MED ORDER — FUROSEMIDE 10 MG/ML IJ SOLN
40.0000 mg | Freq: Two times a day (BID) | INTRAMUSCULAR | Status: DC
  Administered 2021-12-02: 40 mg via INTRAVENOUS
  Filled 2021-12-02 (×2): qty 4

## 2021-12-02 MED ORDER — ASPIRIN 81 MG PO CHEW
81.0000 mg | CHEWABLE_TABLET | ORAL | Status: AC
  Administered 2021-12-02: 81 mg via ORAL
  Filled 2021-12-02: qty 1

## 2021-12-02 MED ORDER — SODIUM CHLORIDE 0.9 % IV SOLN: 250.0000 mL | INTRAVENOUS | Status: DC | PRN

## 2021-12-02 NOTE — TOC Benefit Eligibility Note (Signed)
Patient Product/process development scientist completed.    The patient is currently admitted and upon discharge could be taking Eliquis 5 mg.  The current 30 day co-pay is, $161.63.   The patient is currently admitted and upon discharge could be taking Farxiga 10 mg.  The current 30 day co-pay is, $162.94.   The patient is currently admitted and upon discharge could be taking Jardiance 10 mg.  The current 30 day co-pay is, $171.01.   The patient is currently admitted and upon discharge could be taking Entresto 24-26 mg.  Requires Prior Authorization  The patient is insured through [employer]     Roland Earl, CPhT Pharmacy Patient Advocate Specialist [facility] Patient Advocate Team Direct Number: [phone]  Fax: [phone]

## 2021-12-02 NOTE — TOC Progression Note (Signed)
Transition of Care [facility]) - Progression Note    Patient Details  Name: Adam Henson MRN: [id number] Date of Birth: [birth year]  [facility] of [facility] CM/SW Contact  Leone Haven, RN Phone Number: 12/02/2021, 4:14 PM  Clinical Narrative:     from home, CHF, new afib RVR, for cath today, conts on IV lasix bid, amio drip stopped.  Benefit check completed on jardiance, farxiga, eliquis and entresto.  TOC will continue to follow for dc needs.        Expected Discharge Plan and Services                                                 Social Determinants of Health (SDOH) Interventions    Readmission Risk Interventions     No data to display

## 2021-12-02 NOTE — Progress Notes (Signed)
PROGRESS NOTE    Adam Henson  VFI:[id number] DOB: 1960-08-18 DOA: 11/28/2021 PCP: Pcp, No   Brief Narrative:   Adam Henson is a pleasant 61 y.o. male who denies any significant past medical history though has not seen a physician in a few years and now presents with 3 weeks of bilateral lower extremity swelling, shortness of breath, orthopnea, and palpitations.  Assessment & Plan:   Principal Problem:   Atrial fibrillation with RVR (HCC) Active Problems:   Acute CHF (HCC)   Elevated troponin   Elevated LFTs   Stage 3b chronic kidney disease (CKD) (HCC)  New-onset atrial fibrillation with RVR, improving -Likely provoked by heart failure exacerbation, possible underlying other cardiac etiology -Cardiology consulted, appreciate insight and recommendations -Initially on Cardizem drip transition to low-dose metoprolol, continues to be somewhat tachycardic cardiology transitioning to amiodarone -CHA2DS2-VASc of 1 (HF) - currently on heparin drip   New onset systolic heart failure, acute exacerbation -Cardiology consulted, appreciate insight and recommendations -Pending further work-up and evaluation with cardiology -likely pending right and left heart cath -Continue diuretics IV Lasix twice daily  -Patient may benefit from Entresto/ARB/ACE pending further evaluation  Elevated troponin  -Minimally elevated but increasing overnight currently 500  Elevated LFTs  -Again likely secondary to demand ischemia in setting of RVR and hypervolemia/congestive hepatopathy -Hepatitis HIV unremarkable INR minimally elevated at 1.4  AKI versus previously undocumented CKD -Distant history creatinine 1.5 -1.9 at intake downtrending appropriately -Follow I's and O's, avoid IV fluids in the setting of above    DVT prophylaxis: Lovenox  Code Status: Full  Family Communication: None present  Status is: Inpatient  Dispo: The patient is from: Home              Anticipated d/c is to: Home               Anticipated d/c date is: 48 to 72 hours              Patient currently not medically stable for discharge  Consultants:  None  Procedures:  None  Antimicrobials:  None  Subjective: No acute issues or events overnight symptoms currently well controlled but heart rate remains elevated, awaiting further work-up with cardiology pending cardiac cath later today.  Objective: Vitals:   12/02/21 0300 12/02/21 0400 12/02/21 0500 12/02/21 0600  BP: 105/84 (!) 106/91 100/88 110/81  Pulse:      Resp:      Temp:      TempSrc:      SpO2:      Weight:  96.7 kg    Height:        Intake/Output Summary (Last 24 hours) at 12/02/2021 0804 Last data filed at 12/01/2021 1828 Gross per 24 hour  Intake 480 ml  Output 1800 ml  Net -1320 ml    Filed Weights   11/30/21 1002 12/01/21 0506 12/02/21 0400  Weight: 101.1 kg 97.8 kg 96.7 kg    Examination:  General:  Pleasantly resting in bed, No acute distress. HEENT:  Normocephalic atraumatic.  Sclerae nonicteric, noninjected.  Extraocular movements intact bilaterally. Neck:  Without mass or deformity.  Trachea is midline. Lungs:  Clear to auscultate bilaterally without rhonchi, wheeze, or rales. Heart: Irregularly irregular; tachycardic into the 120s today- without murmurs, rubs, or gallops. Abdomen:  Soft, nontender, nondistended.  Without guarding or rebound. Extremities: Without cyanosis, clubbing, edema, or obvious deformity. Vascular:  Dorsalis pedis and posterior tibial pulses palpable bilaterally. Skin:  Warm and dry, no erythema,  no ulcerations.   Data Reviewed: I have personally reviewed following labs and imaging studies  CBC: Recent Labs  Lab 11/28/21 2126 11/29/21 0535 11/30/21 0215 12/01/21 0307 12/02/21 0406  WBC 11.0* 9.7 9.6 9.6 9.4  NEUTROABS 7.0  --   --   --   --   HGB 16.3 14.8 14.4 14.4 15.3  HCT 48.7 44.1 43.6 44.0 44.7  MCV 90.2 88.4 88.1 88.9 87.5  PLT 206 172 161 167 171    Basic Metabolic  Panel: Recent Labs  Lab 11/28/21 2126 11/29/21 0535 11/30/21 0215 12/01/21 0307  NA 140 141 134* 138  K 5.1 3.7 3.6 3.7  CL 105 105 103 100  CO2 25 26 23 27   GLUCOSE 100* 109* 100* 100*  BUN 27* 27* 25* 23*  CREATININE 1.92* 1.86* 1.75* 1.69*  CALCIUM 9.0 8.7* 8.2* 8.2*  MG 2.2  --   --   --     GFR: Estimated Creatinine Clearance: 55.2 mL/min (A) (by C-G formula based on SCr of 1.69 mg/dL (H)). Liver Function Tests: Recent Labs  Lab 11/28/21 2126 11/29/21 0535 11/30/21 0215 12/01/21 0307  AST 65* 51* 45* 34  ALT 67* 60* 59* 50*  ALKPHOS 50 47 45 48  BILITOT 3.2* 1.9* 1.2 1.3*  PROT 6.0* 5.4* 5.5* 5.3*  ALBUMIN 3.6 3.1* 3.1* 2.9*    No results for input(s): "LIPASE", "AMYLASE" in the last 168 hours. No results for input(s): "AMMONIA" in the last 168 hours. Coagulation Profile: Recent Labs  Lab 11/29/21 0535  INR 1.4*    Cardiac Enzymes: No results for input(s): "CKTOTAL", "CKMB", "CKMBINDEX", "TROPONINI" in the last 168 hours. BNP (last 3 results) No results for input(s): "PROBNP" in the last 8760 hours. HbA1C: No results for input(s): "HGBA1C" in the last 72 hours. CBG: No results for input(s): "GLUCAP" in the last 168 hours. Lipid Profile: Recent Labs    12/02/21 0406  CHOL 152  HDL 38*  LDLCALC 102*  TRIG 59  CHOLHDL 4.0   Thyroid Function Tests: No results for input(s): "TSH", "T4TOTAL", "FREET4", "T3FREE", "THYROIDAB" in the last 72 hours.  Anemia Panel: No results for input(s): "VITAMINB12", "FOLATE", "FERRITIN", "TIBC", "IRON", "RETICCTPCT" in the last 72 hours. Sepsis Labs: Recent Labs  Lab 12/01/21 1241  LATICACIDVEN 1.7    No results found for this or any previous visit (from the past 240 hour(s)).       Radiology Studies: No results found.  Scheduled Meds:  amiodarone  150 mg Intravenous Once   [START ON 12/03/2021] aspirin  81 mg Oral Pre-Cath   feeding supplement  237 mL Oral BID BM   furosemide  40 mg Intravenous BID    multivitamin with minerals  1 tablet Oral Daily   sodium chloride flush  3 mL Intravenous Q12H   Continuous Infusions:  sodium chloride     [START ON 12/03/2021] sodium chloride     amiodarone Stopped (12/01/21 1637)   heparin 1,300 Units/hr (12/02/21 0017)     LOS: 4 days   Time spent: 12/04/21  , DO [facility]  If 7PM-7AM, please contact night-coverage [email or web address]  12/02/2021, 8:04 AM

## 2021-12-03 ENCOUNTER — Inpatient Hospital Stay: Payer: Self-pay

## 2021-12-03 ENCOUNTER — Encounter (HOSPITAL_COMMUNITY): Payer: Self-pay | Admitting: Family Medicine

## 2021-12-03 ENCOUNTER — Encounter (HOSPITAL_COMMUNITY)
Admission: EM | Disposition: A | Discharge status: Home / Self Care | Payer: Self-pay | Source: Home / Self Care | Attending: Internal Medicine

## 2021-12-03 ENCOUNTER — Inpatient Hospital Stay (HOSPITAL_COMMUNITY): Payer: BC Managed Care – PPO | Admitting: General Practice

## 2021-12-03 ENCOUNTER — Inpatient Hospital Stay (HOSPITAL_COMMUNITY): Payer: BC Managed Care – PPO

## 2021-12-03 DIAGNOSIS — I340 Nonrheumatic mitral (valve) insufficiency: Secondary | ICD-10-CM

## 2021-12-03 DIAGNOSIS — I4891 Unspecified atrial fibrillation: Secondary | ICD-10-CM | POA: Diagnosis not present

## 2021-12-03 DIAGNOSIS — N179 Acute kidney failure, unspecified: Secondary | ICD-10-CM | POA: Diagnosis not present

## 2021-12-03 DIAGNOSIS — I420 Dilated cardiomyopathy: Secondary | ICD-10-CM | POA: Diagnosis not present

## 2021-12-03 DIAGNOSIS — R570 Cardiogenic shock: Secondary | ICD-10-CM | POA: Diagnosis not present

## 2021-12-03 DIAGNOSIS — R778 Other specified abnormalities of plasma proteins: Secondary | ICD-10-CM | POA: Diagnosis not present

## 2021-12-03 DIAGNOSIS — R7989 Other specified abnormal findings of blood chemistry: Secondary | ICD-10-CM | POA: Diagnosis not present

## 2021-12-03 DIAGNOSIS — N1832 Chronic kidney disease, stage 3b: Secondary | ICD-10-CM | POA: Diagnosis not present

## 2021-12-03 DIAGNOSIS — I5021 Acute systolic (congestive) heart failure: Secondary | ICD-10-CM | POA: Diagnosis not present

## 2021-12-03 HISTORY — PX: TEE WITHOUT CARDIOVERSION: SHX5443

## 2021-12-03 LAB — COOXEMETRY PANEL
Carboxyhemoglobin: 1.7 % — ABNORMAL HIGH (ref 0.5–1.5)
Methemoglobin: <0.7 % (ref 0.0–1.5)
O2 Saturation: 52.9 %
Total hemoglobin: 14.8 g/dL (ref 12.0–16.0)

## 2021-12-03 LAB — COMPREHENSIVE METABOLIC PANEL
ALT: 46 U/L — ABNORMAL HIGH (ref 0–44)
AST: 36 U/L (ref 15–41)
Albumin: 3.4 g/dL — ABNORMAL LOW (ref 3.5–5.0)
Alkaline Phosphatase: 44 U/L (ref 38–126)
Anion gap: 9 (ref 5–15)
BUN: 19 mg/dL (ref 6–20)
CO2: 32 mmol/L (ref 22–32)
Calcium: 9.2 mg/dL (ref 8.9–10.3)
Chloride: 100 mmol/L (ref 98–111)
Creatinine, Ser: 1.72 mg/dL — ABNORMAL HIGH (ref 0.61–1.24)
GFR, Estimated: 45 mL/min — ABNORMAL LOW (ref >60)
Glucose, Bld: 102 mg/dL — ABNORMAL HIGH (ref 70–99)
Potassium: 3.9 mmol/L (ref 3.5–5.1)
Sodium: 141 mmol/L (ref 135–145)
Total Bilirubin: 1.5 mg/dL — ABNORMAL HIGH (ref 0.3–1.2)
Total Protein: 6.2 g/dL — ABNORMAL LOW (ref 6.5–8.1)

## 2021-12-03 LAB — HEPARIN LEVEL (UNFRACTIONATED)
Heparin Unfractionated: 0.28 IU/mL — ABNORMAL LOW (ref 0.30–0.70)
Heparin Unfractionated: 0.39 IU/mL (ref 0.30–0.70)

## 2021-12-03 LAB — PROTIME-INR
INR: 1.2 (ref 0.8–1.2)
Prothrombin Time: 15.2 seconds (ref 11.4–15.2)

## 2021-12-03 LAB — CBC
HCT: 46.8 % (ref 39.0–52.0)
Hemoglobin: 14.8 g/dL (ref 13.0–17.0)
MCH: 28.8 pg (ref 26.0–34.0)
MCHC: 31.6 g/dL (ref 30.0–36.0)
MCV: 91.1 fL (ref 80.0–100.0)
Platelets: 179 10*3/uL (ref 150–400)
RBC: 5.14 MIL/uL (ref 4.22–5.81)
RDW: 13.3 % (ref 11.5–15.5)
WBC: 9.1 10*3/uL (ref 4.0–10.5)
nRBC: 0 % (ref 0.0–0.2)

## 2021-12-03 LAB — LACTIC ACID, PLASMA
Lactic Acid, Venous: 1.3 mmol/L (ref 0.5–1.9)
Lactic Acid, Venous: 1.9 mmol/L (ref 0.5–1.9)

## 2021-12-03 SURGERY — ECHOCARDIOGRAM, TRANSESOPHAGEAL
Anesthesia: Monitor Anesthesia Care

## 2021-12-03 MED ORDER — ATORVASTATIN CALCIUM 40 MG PO TABS
40.0000 mg | ORAL_TABLET | Freq: Every day | ORAL | Status: DC
  Administered 2021-12-04 – 2021-12-08 (×5): 40 mg via ORAL
  Filled 2021-12-03 (×5): qty 1

## 2021-12-03 MED ORDER — SODIUM CHLORIDE 0.9% FLUSH
10.0000 mL | Freq: Two times a day (BID) | INTRAVENOUS | Status: DC
  Administered 2021-12-04 – 2021-12-08 (×6): 10 mL

## 2021-12-03 MED ORDER — AMIODARONE HCL IN DEXTROSE 360-4.14 MG/200ML-% IV SOLN
30.0000 mg/h | INTRAVENOUS | Status: DC
  Administered 2021-12-03: 30 mg/h via INTRAVENOUS
  Administered 2021-12-04 – 2021-12-06 (×5): 60 mg/h via INTRAVENOUS
  Administered 2021-12-06: 30 mg/h via INTRAVENOUS
  Filled 2021-12-03: qty 400
  Filled 2021-12-03 (×9): qty 200

## 2021-12-03 MED ORDER — NOREPINEPHRINE 4 MG/250ML-% IV SOLN
0.0000 ug/min | INTRAVENOUS | Status: DC
  Filled 2021-12-03: qty 250

## 2021-12-03 MED ORDER — PHENYLEPHRINE 80 MCG/ML (10ML) SYRINGE FOR IV PUSH (FOR BLOOD PRESSURE SUPPORT)
PREFILLED_SYRINGE | INTRAVENOUS | Status: DC | PRN
  Administered 2021-12-03: 80 ug via INTRAVENOUS
  Administered 2021-12-03 (×2): 160 ug via INTRAVENOUS
  Administered 2021-12-03: 80 ug via INTRAVENOUS
  Administered 2021-12-03 (×2): 160 ug via INTRAVENOUS
  Administered 2021-12-03: 80 ug via INTRAVENOUS
  Administered 2021-12-03: 160 ug via INTRAVENOUS

## 2021-12-03 MED ORDER — PROPOFOL 10 MG/ML IV BOLUS
INTRAVENOUS | Status: DC | PRN
  Administered 2021-12-03: 10 mg via INTRAVENOUS
  Administered 2021-12-03: 20 mg via INTRAVENOUS

## 2021-12-03 MED ORDER — CHLORHEXIDINE GLUCONATE CLOTH 2 % EX PADS
6.0000 | MEDICATED_PAD | Freq: Every day | CUTANEOUS | Status: DC
Start: 2021-12-03 — End: 2021-12-08
  Administered 2021-12-03 – 2021-12-07 (×5): 6 via TOPICAL

## 2021-12-03 MED ORDER — AMIODARONE LOAD VIA INFUSION
150.0000 mg | Freq: Once | INTRAVENOUS | Status: AC
  Administered 2021-12-03: 150 mg via INTRAVENOUS
  Filled 2021-12-03: qty 83.34

## 2021-12-03 MED ORDER — SODIUM CHLORIDE 0.9% FLUSH: 10.0000 mL | INTRAVENOUS | Status: DC | PRN

## 2021-12-03 MED ORDER — AMIODARONE HCL IN DEXTROSE 360-4.14 MG/200ML-% IV SOLN
60.0000 mg/h | INTRAVENOUS | Status: AC
  Administered 2021-12-03 (×2): 60 mg/h via INTRAVENOUS
  Filled 2021-12-03 (×2): qty 200

## 2021-12-03 MED ORDER — LIDOCAINE 2% (20 MG/ML) 5 ML SYRINGE
INTRAMUSCULAR | Status: DC | PRN
  Administered 2021-12-03: 100 mg via INTRAVENOUS

## 2021-12-03 MED ORDER — PROPOFOL 500 MG/50ML IV EMUL
INTRAVENOUS | Status: DC | PRN
  Administered 2021-12-03: 150 ug/kg/min via INTRAVENOUS

## 2021-12-03 MED ORDER — DOBUTAMINE IN D5W 4-5 MG/ML-% IV SOLN
1.0000 ug/kg/min | INTRAVENOUS | Status: DC
  Administered 2021-12-03 – 2021-12-06 (×3): 2.5 ug/kg/min via INTRAVENOUS
  Filled 2021-12-03 (×4): qty 250

## 2021-12-03 NOTE — Progress Notes (Signed)
PROGRESS NOTE    THELMER LEGLER  NWG:[id number] DOB: 11-16-1960 DOA: 11/28/2021 PCP: Pcp, No   Brief Narrative:   Adam Henson is a pleasant 61 y.o. male who denies any significant past medical history though has not seen a physician in a few years and now presents with 3 weeks of bilateral lower extremity swelling, shortness of breath, orthopnea, and palpitations.  Assessment & Plan:   Principal Problem:   Atrial fibrillation with RVR (HCC) Active Problems:   Acute CHF (HCC)   Elevated troponin   Elevated LFTs   Stage 3b chronic kidney disease (CKD) (HCC)   AKI (acute kidney injury) (HCC)   DCM (dilated cardiomyopathy) (HCC)   Acute systolic CHF (congestive heart failure) (HCC)   New-onset atrial fibrillation with RVR, improving -Likely provoked by heart failure exacerbation, possible underlying other cardiac etiology -Cardiology consulted, appreciate insight and recommendations -No improvement on diltiazem, metoprolol and could not tolerate amiodarone drip -cardioversion on the 20th was unsuccessful due to noted thrombus, continue anticoagulation per cardiology -CHA2DS2-VASc of 1 for heart failure (?diagnosis of hypertension) - currently on heparin drip   New onset systolic heart failure, acute exacerbation -Cardiology consulted, appreciate insight and recommendations -In the setting of above with noted thrombus -Continue diuretics IV Lasix twice daily  -Patient may benefit from Entresto/ARB/ACE pending further evaluation versus cardiac recovery  Elevated troponin  -Minimally elevated but increasing overnight currently 500  Elevated LFTs  -Again likely secondary to demand ischemia in setting of RVR and hypervolemia/congestive hepatopathy -Hepatitis HIV unremarkable INR minimally elevated at 1.4  AKI versus previously undocumented/new CKD -Distant history creatinine 1.5 -1.9 at intake downtrending appropriately -Creatinine stable despite diuretics, likely at baseline  concern for CKD 2 -new diagnosis if creatinine does not recover  DVT prophylaxis: Lovenox  Code Status: Full  Family Communication: None present  Status is: Inpatient  Dispo: The patient is from: Home              Anticipated d/c is to: Home              Anticipated d/c date is: 48 to 72 hours              Patient currently not medically stable for discharge  Consultants:  None  Procedures:  None  Antimicrobials:  None  Subjective: Taken down for DCCV today with noted thrombus transitioned to 2 heart ICU for close monitoring in the setting of anticoagulation atrial fibrillation and heart failure.  Objective: Vitals:   12/02/21 2117 12/03/21 0000 12/03/21 0429 12/03/21 0746  BP: 94/68 90/72 114/68 104/75  Pulse:   (!) 59 (!) 116  Resp: 18 18 18 19   Temp: 98.7 F (37.1 C) 97.9 F (36.6 C) 97.9 F (36.6 C) 98.4 F (36.9 C)  TempSrc: Oral Oral Oral Oral  SpO2: 96% 98% 96% 95%  Weight:   94.1 kg   Height:        Intake/Output Summary (Last 24 hours) at 12/03/2021 0753 Last data filed at 12/03/2021 0600 Gross per 24 hour  Intake 1786.34 ml  Output 4400 ml  Net -2613.66 ml    Filed Weights   12/01/21 0506 12/02/21 0400 12/03/21 0429  Weight: 97.8 kg 96.7 kg 94.1 kg    Examination:  General:  Pleasantly resting in bed, No acute distress. HEENT:  Normocephalic atraumatic.  Sclerae nonicteric, noninjected.  Extraocular movements intact bilaterally. Neck:  Without mass or deformity.  Trachea is midline. Lungs:  Clear to auscultate bilaterally without rhonchi,  wheeze, or rales. Heart: Irregularly irregular; tachycardic into the 110-120s today- without murmurs, rubs, or gallops. Abdomen:  Soft, nontender, nondistended.  Without guarding or rebound. Extremities: Without cyanosis, clubbing, edema, or obvious deformity. Vascular:  Dorsalis pedis and posterior tibial pulses palpable bilaterally. Skin:  Warm and dry, no erythema, no ulcerations.   Data Reviewed: I have  personally reviewed following labs and imaging studies  CBC: Recent Labs  Lab 11/28/21 2126 11/29/21 0535 11/30/21 0215 12/01/21 0307 12/02/21 0406 12/03/21 0153  WBC 11.0* 9.7 9.6 9.6 9.4 9.1  NEUTROABS 7.0  --   --   --   --   --   HGB 16.3 14.8 14.4 14.4 15.3 14.8  HCT 48.7 44.1 43.6 44.0 44.7 46.8  MCV 90.2 88.4 88.1 88.9 87.5 91.1  PLT 206 172 161 167 171 0000000    Basic Metabolic Panel: Recent Labs  Lab 11/28/21 2126 11/29/21 0535 11/30/21 0215 12/01/21 0307 12/02/21 0406  NA 140 141 134* 138 139  K 5.1 3.7 3.6 3.7 3.8  CL 105 105 103 100 100  CO2 25 26 23 27 29   GLUCOSE 100* 109* 100* 100* 95  BUN 27* 27* 25* 23* 23*  CREATININE 1.92* 1.86* 1.75* 1.69* 1.96*  CALCIUM 9.0 8.7* 8.2* 8.2* 8.6*  MG 2.2  --   --   --   --     GFR: Estimated Creatinine Clearance: 46.9 mL/min (A) (by C-G formula based on SCr of 1.96 mg/dL (H)). Liver Function Tests: Recent Labs  Lab 11/28/21 2126 11/29/21 0535 11/30/21 0215 12/01/21 0307  AST 65* 51* 45* 34  ALT 67* 60* 59* 50*  ALKPHOS 50 47 45 48  BILITOT 3.2* 1.9* 1.2 1.3*  PROT 6.0* 5.4* 5.5* 5.3*  ALBUMIN 3.6 3.1* 3.1* 2.9*    No results for input(s): "LIPASE", "AMYLASE" in the last 168 hours. No results for input(s): "AMMONIA" in the last 168 hours. Coagulation Profile: Recent Labs  Lab 11/29/21 0535 12/03/21 0153  INR 1.4* 1.2    Cardiac Enzymes: No results for input(s): "CKTOTAL", "CKMB", "CKMBINDEX", "TROPONINI" in the last 168 hours. BNP (last 3 results) No results for input(s): "PROBNP" in the last 8760 hours. HbA1C: No results for input(s): "HGBA1C" in the last 72 hours. CBG: No results for input(s): "GLUCAP" in the last 168 hours. Lipid Profile: Recent Labs    12/02/21 0406  CHOL 152  HDL 38*  LDLCALC 102*  TRIG 59  CHOLHDL 4.0    Thyroid Function Tests: No results for input(s): "TSH", "T4TOTAL", "FREET4", "T3FREE", "THYROIDAB" in the last 72 hours.  Anemia Panel: No results for  input(s): "VITAMINB12", "FOLATE", "FERRITIN", "TIBC", "IRON", "RETICCTPCT" in the last 72 hours. Sepsis Labs: Recent Labs  Lab 12/01/21 1241  LATICACIDVEN 1.7     No results found for this or any previous visit (from the past 240 hour(s)).       Radiology Studies: No results found.  Scheduled Meds:  amiodarone  150 mg Intravenous Once   feeding supplement  237 mL Oral BID BM   furosemide  40 mg Intravenous BID   multivitamin with minerals  1 tablet Oral Daily   sodium chloride flush  3 mL Intravenous Q12H   Continuous Infusions:  sodium chloride     sodium chloride 10 mL/hr at 12/02/21 1037   sodium chloride     amiodarone Stopped (12/01/21 1637)   heparin 1,350 Units/hr (12/03/21 0749)     LOS: 5 days   Time spent: 71min  Luanna Cole  Natale Milch, DO [facility]  If 7PM-7AM, please contact night-coverage [email or web address]  12/03/2021, 7:53 AM

## 2021-12-03 NOTE — Progress Notes (Signed)
ANTICOAGULATION CONSULT NOTE  Pharmacy Consult for heparin Indication: atrial fibrillation  No Known Allergies  Patient Measurements: Height: 5\' 11"  (180.3 cm) Weight: 94.1 kg (207 lb 6.4 oz) IBW/kg (Calculated) : 75.3 Heparin Dosing Weight: 96.2 kg  Vital Signs: Temp: 97.7 F (36.5 C) (06/20 1129) Temp Source: Oral (06/20 1129) BP: 99/77 (06/20 1145) Pulse Rate: 128 (06/20 1129)  Labs: Recent Labs    12/01/21 0307 12/01/21 1140 12/02/21 0406 12/03/21 0153 12/03/21 1211 12/03/21 1331  HGB 14.4  --  15.3 14.8  --   --   HCT 44.0  --  44.7 46.8  --   --   PLT 167  --  171 179  --   --   LABPROT  --   --   --  15.2  --   --   INR  --   --   --  1.2  --   --   HEPARINUNFRC 0.40   < > 0.31 0.28*  --  0.39  CREATININE 1.69*  --  1.96*  --  1.72*  --    < > = values in this interval not displayed.     Estimated Creatinine Clearance: 53.5 mL/min (A) (by C-G formula based on SCr of 1.72 mg/dL (H)).   Medical History: Past Medical History:  Diagnosis Date   Hyperlipidemia       Scheduled:   amiodarone  150 mg Intravenous Once   [START ON 12/04/2021] atorvastatin  40 mg Oral Daily   Chlorhexidine Gluconate Cloth  6 each Topical Daily   feeding supplement  237 mL Oral BID BM   multivitamin with minerals  1 tablet Oral Daily   sodium chloride flush  3 mL Intravenous Q12H   Infusions:   amiodarone 60 mg/hr (12/03/21 1319)   Followed by   amiodarone     DOBUTamine 2.5 mcg/kg/min (12/03/21 1322)   heparin 1,350 Units/hr (12/03/21 1306)   norepinephrine (LEVOPHED) Adult infusion      Assessment: Pt is a 61 yo male who presents with new onset afib and heart failure. Pharmacy consutled to dose heparin. CBC WNL. Patient is not on anticoagulation PTA.   Heparin level 0.39 on recheck after rate adjustment this morning. INR and cbc within normal limits.   Goal of Therapy:  Heparin level 0.3-0.7 units/ml Monitor platelets by anticoagulation protocol: Yes    Plan:   Continue Heparin at 1350 units / hr Daily heparin level and cbc  Thank you, 67 PharmD., BCPS Clinical Pharmacist 12/03/2021 3:00 PM

## 2021-12-03 NOTE — Progress Notes (Signed)
Peripherally Inserted Central Catheter Placement  The IV Nurse has discussed with the patient and/or persons authorized to consent for the patient, the purpose of this procedure and the potential benefits and risks involved with this procedure.  The benefits include less needle sticks, lab draws from the catheter, and the patient may be discharged home with the catheter. Risks include, but not limited to, infection, bleeding, blood clot (thrombus formation), and puncture of an artery; nerve damage and irregular heartbeat and possibility to perform a PICC exchange if needed/ordered by physician.  Alternatives to this procedure were also discussed.  Bard Power PICC patient education guide, fact sheet on infection prevention and patient information card has been provided to patient /or left at bedside.    PICC Placement Documentation  PICC Triple Lumen 12/03/21 Right Brachial 42 cm 0 cm (Active)  Indication for Insertion or Continuance of Line Vasoactive infusions 12/03/21 1729  Exposed Catheter (cm) 0 cm 12/03/21 1729  Site Assessment Clean, Dry, Intact 12/03/21 1729  Lumen #1 Status Flushed;Saline locked;Blood return noted 12/03/21 1729  Lumen #2 Status Flushed;Saline locked;Blood return noted 12/03/21 1729  Lumen #3 Status Flushed;Saline locked;Blood return noted 12/03/21 1729  Dressing Type Transparent;Securing device 12/03/21 1729  Dressing Status Antimicrobial disc in place;Clean, Dry, Intact 12/03/21 1729  Safety Lock Not Applicable 12/03/21 1729  Dressing Intervention New dressing;Other (Comment) 12/03/21 1729  Dressing Change Due 12/10/21 12/03/21 1729       Annett Fabian 12/03/2021, 5:31 PM

## 2021-12-03 NOTE — Progress Notes (Signed)
ANTICOAGULATION CONSULT NOTE  Pharmacy Consult for heparin Indication: atrial fibrillation  No Known Allergies  Patient Measurements: Height: 5\' 11"  (180.3 cm) Weight: 94.1 kg (207 lb 6.4 oz) IBW/kg (Calculated) : 75.3 Heparin Dosing Weight: 96.2 kg  Vital Signs: Temp: 97.9 F (36.6 C) (06/20 0429) Temp Source: Oral (06/20 0429) BP: 114/68 (06/20 0429) Pulse Rate: 59 (06/20 0429)  Labs: Recent Labs    12/01/21 0307 12/01/21 1140 12/02/21 0406 12/03/21 0153  HGB 14.4  --  15.3 14.8  HCT 44.0  --  44.7 46.8  PLT 167  --  171 179  LABPROT  --   --   --  15.2  INR  --   --   --  1.2  HEPARINUNFRC 0.40 0.44 0.31 0.28*  CREATININE 1.69*  --  1.96*  --      Estimated Creatinine Clearance: 46.9 mL/min (A) (by C-G formula based on SCr of 1.96 mg/dL (H)).   Medical History: Past Medical History:  Diagnosis Date   Hyperlipidemia       Scheduled:   amiodarone  150 mg Intravenous Once   feeding supplement  237 mL Oral BID BM   furosemide  40 mg Intravenous BID   multivitamin with minerals  1 tablet Oral Daily   sodium chloride flush  3 mL Intravenous Q12H   Infusions:   sodium chloride     sodium chloride 10 mL/hr at 12/02/21 1037   sodium chloride     amiodarone Stopped (12/01/21 1637)   heparin 1,300 Units/hr (12/02/21 1814)    Assessment: Pt is a 61 yo male who presents with new onset afib and heart failure. Pharmacy consutled to dose heparin. CBC WNL. Patient is not on anticoagulation PTA.   Heparin level 0.28 today  Goal of Therapy:  Heparin level 0.3-0.7 units/ml Monitor platelets by anticoagulation protocol: Yes    Plan:  Heparin to 1350 units / hr Follow up plans for cath  Thank you 67, PharmD  12/03/2021 7:34 AM Check [email or web address] for unit specific pharmacy number

## 2021-12-03 NOTE — Progress Notes (Addendum)
Unfortunately patient was found to have a large left atrial thrombus on TEE so cardioversion canceled.  He remains in atrial fibrillation with RVR as high as the 160s at rest.    Per Dr. Elease Hashimoto, LV dysfunction appears well under 20% on TEE.  During the procedure he dropped his pressure and required 800 mcg of Neo-Synephrine IV.  He had moderate MR with no flow reserve flow in the pulmonary veins.  There was also a PFO noted.  Seen again in after TEE and HR in the 170's.    GEN: Well nourished, well developed in no acute distress HEENT: Normal NECK: + JVD; No carotid bruits LYMPHATICS: No lymphadenopathy CARDIAC:irregularly irregular and tachycardic, no murmurs, rubs, gallops RESPIRATORY:  Clear to auscultation without rales, wheezing or rhonchi  ABDOMEN: Soft, non-tender, non-distended MUSCULOSKELETAL:  No edema; No deformity  SKIN: Warm and dry NEUROLOGIC:  Alert and oriented x 3 PSYCHIATRIC:  Normal affect    I have discussed case with EP.  He has not tolerated IV boluses of Amio even given over a slow period of time and drops his blood pressure into the 70s.  He is also not tolerated beta-blocker therapy. Dr. Ladona Ridgel recommends trying again low-dose IV Amio and consider IV digoxin.  Will wait for BMET to get back before starting Dig.   He has diuresed well and put out 4.4L yesterday and is net neg 13.8L since admit.  BMET pending this am but SCr bumped to 1.96 yesterday after initial improvement likely related to hypotensive event on Sunday but could be over diuresed at this point.  His lungs are clear and his 3+ LE edema has resolved.  Still having hypotension at times and suspect he is in cardiogenic shock with severe low output failure exacerbated by rapid afib.   I have asked Dr. Gala Romney with advanced heart failure to evaluate patient.   Will transfer to CCU.  Per AHF, start low dose Dobutamine 2.40mcg.  Will likely need IV Levo gtt to help support BP while on IV Amio.  Get PICC and CVP to  help establish current volume status and get Co-Ox.  Hold any further diuretics until we get CVP.    Performed by: Armanda Magic, MD   Total critical care time: 40 minutes   Critical care time was exclusive of separately billable procedures and treating other patients.   Critical care was necessary to treat or prevent imminent or life-threatening deterioration.   Critical care was time spent personally by me (independent of APPs or residents) on the following activities: development of treatment plan with patient and/or surrogate as well as nursing, discussions with consultants, evaluation of patient's response to treatment, examination of patient, obtaining history from patient or surrogate, ordering and performing treatments and interventions, ordering and review of laboratory studies, ordering and review of radiographic studies, pulse oximetry and re-evaluation of patient's condition.

## 2021-12-03 NOTE — Anesthesia Preprocedure Evaluation (Signed)
Anesthesia Evaluation  Patient identified by MRN, date of birth, ID band Patient awake    Reviewed: Allergy & Precautions, NPO status , Patient's Chart, lab work & pertinent test results  Airway Mallampati: II  TM Distance: >3 FB Neck ROM: Full    Dental no notable dental hx.    Pulmonary neg pulmonary ROS, former smoker,    Pulmonary exam normal breath sounds clear to auscultation       Cardiovascular +CHF  Normal cardiovascular exam+ dysrhythmias Atrial Fibrillation  Rhythm:Regular Rate:Normal     Neuro/Psych negative neurological ROS  negative psych ROS   GI/Hepatic negative GI ROS, Neg liver ROS,   Endo/Other  negative endocrine ROS  Renal/GU Renal InsufficiencyRenal disease  negative genitourinary   Musculoskeletal negative musculoskeletal ROS (+)   Abdominal   Peds negative pediatric ROS (+)  Hematology negative hematology ROS (+)   Anesthesia Other Findings   Reproductive/Obstetrics negative OB ROS                             Anesthesia Physical Anesthesia Plan  ASA: 3  Anesthesia Plan: MAC   Post-op Pain Management: Minimal or no pain anticipated   Induction: Intravenous  PONV Risk Score and Plan: 1 and Propofol infusion and Treatment may vary due to age or medical condition  Airway Management Planned: Nasal Cannula  Additional Equipment:   Intra-op Plan:   Post-operative Plan:   Informed Consent: I have reviewed the patients History and Physical, chart, labs and discussed the procedure including the risks, benefits and alternatives for the proposed anesthesia with the patient or authorized representative who has indicated his/her understanding and acceptance.     Dental advisory given  Plan Discussed with: CRNA  Anesthesia Plan Comments:         Anesthesia Quick Evaluation

## 2021-12-03 NOTE — Transfer of Care (Signed)
Immediate Anesthesia Transfer of Care Note  Patient: Adam Henson  Procedure(s) Performed: TRANSESOPHAGEAL ECHOCARDIOGRAM (TEE)  Patient Location: Endoscopy Unit  Anesthesia Type:MAC  Level of Consciousness: awake and drowsy  Airway & Oxygen Therapy: Patient Spontanous Breathing and Patient connected to nasal cannula oxygen  Post-op Assessment: Report given to RN and Post -op Vital signs reviewed and stable  Post vital signs: Reviewed and stable  Last Vitals:  Vitals Value Taken Time  BP 112/77 12/03/21 1056  Temp 36.7 C 12/03/21 1054  Pulse 114 12/03/21 1058  Resp 22 12/03/21 1058  SpO2 96 % 12/03/21 1058  Vitals shown include unvalidated device data.  Last Pain:  Vitals:   12/03/21 1054  TempSrc: Temporal  PainSc: 0-No pain         Complications: No notable events documented.

## 2021-12-03 NOTE — CV Procedure (Signed)
    Transesophageal Echocardiogram Note  JAMORION GOMILLION [id number] 06/07/1961  Procedure: Transesophageal Echocardiogram Indications: atrial fib   Procedure Details Consent: Obtained Time Out: Verified patient identification, verified procedure, site/side was marked, verified correct patient position, special equipment/implants available, Radiology Safety Procedures followed,  medications/allergies/relevent history reviewed, required imaging and test results available.  Performed  Medications:  During this procedure the patient is administered  Lidocaine 100 mg IV followed by Propofol drip - total of 110 mg for sedation.  He also received Neosynephrine 800 mcg IV to maintain BP .  The patient's heart rate, blood pressure, and oxygen saturation are monitored continuously during the procedure. The period of conscious sedation is 30  minutes, of which I was present face-to-face 100% of this time.  Left Ventrical:  severe LV dysfunction .   EF < 20%.   Mitral Valve: moderate MR ,  There is no reversal of flow in the PV .   Aortic Valve: 3 leaflet valve.  No AS, no AI   Tricuspid Valve: mild TR   Pulmonic Valve: trace PI   Left Atrium/ Left atrial appendage: There is a large thrombus that originates in the LAA that protrudes out into the LA.   It appears to be attached to the appendage wall with a thin stalk .  Moderately mobile.   Atrial septum: + PFO with Left to right shunting   Aorta: normal .    Complications: No apparent complications Patient did tolerate procedure well.  Cardioversion was not performed due to the large LAA thrombus.    Vesta Mixer, Montez Hageman., MD, [facility] 12/03/2021, 10:49 AM

## 2021-12-03 NOTE — Consult Note (Addendum)
Advanced Heart Failure Team Consult Note   Primary Physician: Pcp, No PCP-Cardiologist:  None  Reason for Consultation: Cardiogenic shock  HPI:    Adam Henson is seen today for evaluation of cardiogenic shock at the request of Dr. Mayford Knife with [facility].   60 y.o. male with no significant medical history other than hyperlipidemia until recently. Had not seen a physician regularly for several years. Reports BP intermittently elevated over the years but not treated. Presented to ED 06/15 with worsening dyspnea, orthopnea and LE edema X 3 weeks. He was found to be in atrial fibrillation with RVR rates in 160s-170s. Also appeared volume overloaded. BNP 640, HS troponin 23>52, Scr 1.95, AST 65, ALT 67. He was given 40 mg lasix IV, given bolus IV cardizem and started on cardizem gtt. He was admitted for further management and cardiology consulted. Has diuresed 15 lb so far with IV lasix. Scr had been improving but Scr bumped 1.69>1.96.    Cardizem switched to metoprolol for rate control. On 06/18 he was given IV amio bolus and developed hypotension.  Amiodarone and metoprolol discontinued. Planned for TEE/DCCV today. TEE EF < 20%, moderate MR, large LAA thrombus. Cardioversion aborted. He was hypotensive during the procedure and was given Neosynephrine 800 mcg IV to maintain BP.  He is being transferred to CCU d/t concern for cardiogenic shock.  Patient reports feeling okay. States dyspnea has significantly improved with diuresis. LE edema better. No longer having abdominal bloating or orthopnea.   He works at [employer]. Job requires strenuous labor. He exercises regularly.  Smoked cigarettes from [age] years old. No alcohol or drug use.   Denies family history of CHF.  Review of Systems: [y] = yes, [ ]  = no   General: Weight gain [ ] ; Weight loss [ ] ; Anorexia [ ] ; Fatigue [Y]; Fever [ ] ; Chills [ ] ; Weakness [ ]   Cardiac: Chest pain/pressure [ ] ; Resting SOB [ ] ; Exertional SOB  [Y]; Orthopnea [Y ]; Pedal Edema [Y]; Palpitations [Y]; Syncope [ ] ; Presyncope [ ] ; Paroxysmal nocturnal dyspnea[ ]   Pulmonary: Cough [Y]; Wheezing[ ] ; Hemoptysis[ ] ; Sputum [ ] ; Snoring [ ]   GI: Vomiting[ ] ; Dysphagia[ ] ; Melena[ ] ; Hematochezia [ ] ; Heartburn[ ] ; Abdominal pain [ ] ; Constipation [ ] ; Diarrhea [ ] ; BRBPR [ ]   GU: Hematuria[ ] ; Dysuria [ ] ; Nocturia[ ]   Vascular: Pain in legs with walking [ ] ; Pain in feet with lying flat [ ] ; Non-healing sores [ ] ; Stroke [ ] ; TIA [ ] ; Slurred speech [ ] ;  Neuro: Headaches[ ] ; Vertigo[ ] ; Seizures[ ] ; Paresthesias[ ] ;Blurred vision [ ] ; Diplopia [ ] ; Vision changes [ ]   Ortho/Skin: Arthritis [ ] ; Joint pain [ ] ; Muscle pain [ ] ; Joint swelling [ ] ; Back Pain [ ] ; Rash [ ]   Psych: Depression[ ] ; Anxiety[ ]   Heme: Bleeding problems [ ] ; Clotting disorders [ ] ; Anemia [ ]   Endocrine: Diabetes [ ] ; Thyroid dysfunction[ ]   Home Medications Prior to Admission medications   Medication Sig Start Date End Date Taking? Authorizing Provider  BLACK CURRANT SEED OIL PO Take 1 capsule by mouth daily.   Yes [provider]  ELDERBERRY PO Take 1 capsule by mouth daily.   Yes [provider]  polyethylene glycol (MIRALAX / GLYCOLAX) 17 g packet Take 17 g by mouth daily as needed for mild constipation or moderate constipation.   Yes [provider]  senna (SENOKOT) 8.6 MG tablet Take 1 tablet by mouth  daily as needed for constipation.   Yes [provider]  naproxen (NAPROSYN) 500 MG tablet Take 1 tablet (500 mg total) by mouth 2 (two) times daily with a meal. Patient not taking: Reported on 11/28/2021 12/17/20   Wallis Bamberg, PA-C  predniSONE (DELTASONE) 20 MG tablet Take 2 pills a day for 3 days, then 1 pill a day for 3 days Patient not taking: Reported on 11/28/2021 09/26/13   Copland, Gwenlyn Found, MD  tiZANidine (ZANAFLEX) 4 MG tablet Take 1 tablet (4 mg total) by mouth every 8 (eight) hours as needed. Patient not taking:  Reported on 11/28/2021 12/17/20   Wallis Bamberg, PA-C    Past Medical History: Past Medical History:  Diagnosis Date   Hyperlipidemia     Past Surgical History: Past Surgical History:  Procedure Laterality Date   JOINT REPLACEMENT     MANDIBLE FRACTURE SURGERY      Family History: Family History  Problem Relation Age of Onset   Hyperlipidemia Mother    Breast cancer Mother    Cancer Mother    Hyperlipidemia Father    Lung cancer Father        deceased   Cancer Father     Social History: Social History   Socioeconomic History   Marital status: Single    Spouse name: Not on file   Number of children: Not on file   Years of education: Not on file   Highest education level: Not on file  Occupational History   Not on file  Tobacco Use   Smoking status: Former    Types: Cigarettes    Quit date: 09/24/1987    Years since quitting: 34.2   Smokeless tobacco: Never   Tobacco comments:    Smoked from age 32 until age 24  Substance and Sexual Activity   Alcohol use: No   Drug use: No   Sexual activity: Yes    Partners: Male  Other Topics Concern   Not on file  Social History Narrative   Not on file   Social Determinants of Health   Financial Resource Strain: Not on file  Food Insecurity: Not on file  Transportation Needs: Not on file  Physical Activity: Not on file  Stress: Not on file  Social Connections: Not on file    Allergies:  No Known Allergies  Objective:    Vital Signs:   Temp:  [97.5 F (36.4 C)-98.7 F (37.1 C)] 97.7 F (36.5 C) (06/20 1129) Pulse Rate:  [59-148] 128 (06/20 1129) Resp:  [16-29] 16 (06/20 1129) BP: (90-131)/(60-95) 94/74 (06/20 1129) SpO2:  [92 %-99 %] 97 % (06/20 1129) Weight:  [94.1 kg] 94.1 kg (06/20 0429) Last BM Date : 11/30/21  Weight change: Filed Weights   12/01/21 0506 12/02/21 0400 12/03/21 0429  Weight: 97.8 kg 96.7 kg 94.1 kg    Intake/Output:   Intake/Output Summary (Last 24 hours) at 12/03/2021  1146 Last data filed at 12/03/2021 1045 Gross per 24 hour  Intake 1932.62 ml  Output 2500 ml  Net -567.38 ml      Physical Exam    General:  No distress. Sitting up in bed. HEENT: normal Neck: supple. JVP 10-12. Carotids 2+ bilat; no bruits.  Cor: PMI nondisplaced. Irregular rhythm, tachy. No rubs, gallops or murmurs. Lungs: clear Abdomen: soft, nontender, nondistended.  Extremities: no cyanosis, clubbing, rash, 1+ edema Neuro: alert & orientedx3, cranial nerves grossly intact. moves all 4 extremities w/o difficulty. Affect pleasant   Telemetry   AF  120s-140s up to 160s   EKG    AF with ventricular rate 155  Labs   Basic Metabolic Panel: Recent Labs  Lab 11/28/21 2126 11/29/21 0535 11/30/21 0215 12/01/21 0307 12/02/21 0406  NA 140 141 134* 138 139  K 5.1 3.7 3.6 3.7 3.8  CL 105 105 103 100 100  CO2 25 26 23 27 29   GLUCOSE 100* 109* 100* 100* 95  BUN 27* 27* 25* 23* 23*  CREATININE 1.92* 1.86* 1.75* 1.69* 1.96*  CALCIUM 9.0 8.7* 8.2* 8.2* 8.6*  MG 2.2  --   --   --   --     Liver Function Tests: Recent Labs  Lab 11/28/21 2126 11/29/21 0535 11/30/21 0215 12/01/21 0307  AST 65* 51* 45* 34  ALT 67* 60* 59* 50*  ALKPHOS 50 47 45 48  BILITOT 3.2* 1.9* 1.2 1.3*  PROT 6.0* 5.4* 5.5* 5.3*  ALBUMIN 3.6 3.1* 3.1* 2.9*   No results for input(s): "LIPASE", "AMYLASE" in the last 168 hours. No results for input(s): "AMMONIA" in the last 168 hours.  CBC: Recent Labs  Lab 11/28/21 2126 11/29/21 0535 11/30/21 0215 12/01/21 0307 12/02/21 0406 12/03/21 0153  WBC 11.0* 9.7 9.6 9.6 9.4 9.1  NEUTROABS 7.0  --   --   --   --   --   HGB 16.3 14.8 14.4 14.4 15.3 14.8  HCT 48.7 44.1 43.6 44.0 44.7 46.8  MCV 90.2 88.4 88.1 88.9 87.5 91.1  PLT 206 172 161 167 171 179    Cardiac Enzymes: No results for input(s): "CKTOTAL", "CKMB", "CKMBINDEX", "TROPONINI" in the last 168 hours.  BNP: BNP (last 3 results) Recent Labs    11/28/21 2126  BNP 639.5*     ProBNP (last 3 results) No results for input(s): "PROBNP" in the last 8760 hours.   CBG: No results for input(s): "GLUCAP" in the last 168 hours.  Coagulation Studies: Recent Labs    12/03/21 0153  LABPROT 15.2  INR 1.2     Imaging   No results found.   Medications:     Current Medications:  amiodarone  150 mg Intravenous Once   feeding supplement  237 mL Oral BID BM   furosemide  40 mg Intravenous BID   multivitamin with minerals  1 tablet Oral Daily   sodium chloride flush  3 mL Intravenous Q12H    Infusions:  heparin 1,350 Units/hr (12/03/21 0749)      Patient Profile   61 y.o. male with no significant medical history other than hyperlipidemia and possible untreated HTN. Has not sought routine medical care over the last few years. Admitted with Afib with RVR and acute systolic CHF (new) >>>cardiogenic shock.   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 Suspect tachy-mediated, admitted with AF with RVR. HS troponin up 52>573. Likely d/t demand ischemia but eventually need to r/o CAD. Has remote history of tobacco use. Eventual R/LHC. -Hypotension limiting rate controlling meds. Became acutely hypotensive after sedation for TEE today. Received 800 mcg IV neo to maintain BP -SBP currently > 110 but with narrow pulse pressure -Start DBA 2.5 for inotrope support. Place PICC for CVP and CO-OX monitoring. Check lactic acid -Reattempting IV amiodarone load. May require addition of NE to maintain MAP if becomes hypotensive.  -Has diuresed well but  still appears overloaded. Will continue diuresis with IV lasix once started on inotrope support. -Eventual GDMT -Outpatient sleep study  Atrial fibrillation with RVR -New this admit -IV amio as above -Cardioversion  aborted. Large LAA thrombus on echo -On heparin gtt  AKI on CKD 3a -Baseline uncertain, Scr 1.5 in 2015 -Scr this admit 1.9>1.75>1.7>1.96>1.72 -In setting of shock    Length of Stay: 5  FINCH, LINDSAY N, PA-C  12/03/2021, 11:46 AM  Advanced Heart Failure Team Pager [phone] (M-F; 7a - 5p)  Please contact [facility] for night-coverage after hours (4p -7a ) and weekends on [email or web address]   Agree with above  61 y/o male HL and CKD 3a. No previous cardiac history. Admitted with new onset severe systolic HF in setting of AF with RVR of unknown duration. Developed hypotension and AKI and taken for TEE with potential DC-CV today but aborted due to LAA clot.   Moved to ICU due to hypotension.   Now on DBA. IV amio started. Remains on heparin.   SBP now 90-105. Feeling better but remains cool on exam   General:  Sitting up in bed  No resp difficulty HEENT: normal Neck: supple. No obvious JVD. Carotids 2+ bilat; no bruits. No lymphadenopathy or thryomegaly appreciated. Cor: PMI nondisplaced. Irregular No rubs, gallops or murmurs. Lungs: clear Abdomen: soft, nontender, nondistended. No hepatosplenomegaly. No bruits or masses. Good bowel sounds. Extremities: no cyanosis, clubbing, rash, edema cool Neuro: alert & orientedx3, cranial nerves grossly intact. moves all 4 extremities w/o difficulty. Affect pleasant  He is very tenuous. 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 Now improved with DBA support and amio for rate control.   PICC line pending. May need low-dose NE for support. Will continue heparin.   Will continue amio for rate control despite small chance of chemical cardioversion but currently no other option to manage. Consider cath once stabilized. Needs outpatient sleep study.   CRITICAL CARE Performed by: Arvilla Meres  Total critical care time: 55 minutes  Critical care time was exclusive of separately billable procedures and treating other  patients.  Critical care was necessary to treat or prevent imminent or life-threatening deterioration.  Critical care was time spent personally by me (independent of midlevel providers or residents) on the following activities: development of treatment plan with patient and/or surrogate as well as nursing, discussions with consultants, evaluation of patient's response to treatment, examination of patient, obtaining history from patient or surrogate, ordering and performing treatments and interventions, ordering and review of laboratory studies, ordering and review of radiographic studies, pulse oximetry and re-evaluation of patient's condition.  Arvilla Meres, MD  5:53 PM

## 2021-12-03 NOTE — Progress Notes (Addendum)
Progress Note  Patient Name: Adam Henson Date of Encounter: 12/03/2021  [facility] Cardiologist: None   Subjective   Currently in Endo for TEE/DCCV.  No events overnight. HR remains in the 130's in afib. BP stable at 129/22mMHg.  Inpatient Medications    Scheduled Meds:  [MAR Hold] amiodarone  150 mg Intravenous Once   [MAR Hold] feeding supplement  237 mL Oral BID BM   [MAR Hold] furosemide  40 mg Intravenous BID   [MAR Hold] multivitamin with minerals  1 tablet Oral Daily   [MAR Hold] sodium chloride flush  3 mL Intravenous Q12H   Continuous Infusions:  sodium chloride     sodium chloride 10 mL/hr at 12/03/21 0948   sodium chloride Stopped (12/03/21 0802)   amiodarone Stopped (12/01/21 1637)   heparin 1,350 Units/hr (12/03/21 0749)   PRN Meds: sodium chloride, [MAR Hold] acetaminophen, [MAR Hold] guaiFENesin-dextromethorphan, [MAR Hold] ondansetron (ZOFRAN) IV, sodium chloride flush   Vital Signs    Vitals:   12/03/21 0429 12/03/21 0746 12/03/21 0848 12/03/21 0927  BP: 114/68 104/75 100/70 129/90  Pulse: (!) 59 (!) 116 (!) 120 (!) 128  Resp: 18 19 18  (!) 21  Temp: 97.9 F (36.6 C) 98.4 F (36.9 C) 97.9 F (36.6 C) 97.8 F (36.6 C)  TempSrc: Oral Oral Oral Temporal  SpO2: 96% 95% 99% 96%  Weight: 94.1 kg     Height:        Intake/Output Summary (Last 24 hours) at 12/03/2021 1021 Last data filed at 12/03/2021 0858 Gross per 24 hour  Intake 1786.34 ml  Output 4400 ml  Net -2613.66 ml       12/03/2021    4:29 AM 12/02/2021    4:00 AM 12/01/2021    5:06 AM  Last 3 Weights  Weight (lbs) 207 lb 6.4 oz 213 lb 3 oz 215 lb 8 oz  Weight (kg) 94.076 kg 96.7 kg 97.75 kg      Telemetry    Atrial fibrillation with RVR personally Reviewed  ECG    No new EKG to review- Personally Reviewed  Physical Exam   GEN: Well nourished, well developed in no acute distress HEENT: Normal NECK: No JVD; No carotid bruits LYMPHATICS: No  lymphadenopathy CARDIAC:irregularly irregular and tachy, no murmurs, rubs, gallops RESPIRATORY:  Clear to auscultation without rales, wheezing or rhonchi  ABDOMEN: Soft, non-tender, non-distended MUSCULOSKELETAL:  1+ BLE edema; No deformity  SKIN: Warm and dry NEUROLOGIC:  Alert and oriented x 3 PSYCHIATRIC:  Normal affect   Labs    High Sensitivity Troponin:   Recent Labs  Lab 11/28/21 2126 11/28/21 2356 11/29/21 0535  TROPONINIHS 23* 52* 573*       Chemistry Recent Labs  Lab 11/29/21 0535 11/30/21 0215 12/01/21 0307 12/02/21 0406  NA 141 134* 138 139  K 3.7 3.6 3.7 3.8  CL 105 103 100 100  CO2 26 23 27 29   GLUCOSE 109* 100* 100* 95  BUN 27* 25* 23* 23*  CREATININE 1.86* 1.75* 1.69* 1.96*  CALCIUM 8.7* 8.2* 8.2* 8.6*  PROT 5.4* 5.5* 5.3*  --   ALBUMIN 3.1* 3.1* 2.9*  --   AST 51* 45* 34  --   ALT 60* 59* 50*  --   ALKPHOS 47 45 48  --   BILITOT 1.9* 1.2 1.3*  --   GFRNONAA 41* 44* 46* 38*  ANIONGAP 10 8 11 10       Hematology Recent Labs  Lab 12/01/21 0307 12/02/21 0406 12/03/21  0153  WBC 9.6 9.4 9.1  RBC 4.95 5.11 5.14  HGB 14.4 15.3 14.8  HCT 44.0 44.7 46.8  MCV 88.9 87.5 91.1  MCH 29.1 29.9 28.8  MCHC 32.7 34.2 31.6  RDW 13.4 13.4 13.3  PLT 167 171 179     BNP Recent Labs  Lab 11/28/21 2126  BNP 639.5*      DDimer No results for input(s): "DDIMER" in the last 168 hours.   CHA2DS2-VASc Score = 2   This indicates a 2.2% annual risk of stroke. The patient's score is based upon: CHF History: 1 HTN History: 1 Diabetes History: 0 Stroke History: 0 Vascular Disease History: 0 Age Score: 0 Gender Score: 0    Radiology    No results found.  Cardiac Studies   2D echo 11/29/2021 IMPRESSIONS    1. Left ventricular ejection fraction, by estimation, is 25 to 30%. The  left ventricle has severely decreased function. The left ventricle  demonstrates global hypokinesis. The left ventricular internal cavity size  was moderately  dilated. Left  ventricular diastolic parameters are indeterminate.   2. Right ventricular systolic function is moderately reduced. The right  ventricular size is mildly enlarged. There is moderately elevated  pulmonary artery systolic pressure.   3. Left atrial size was moderately dilated.   4. The mitral valve is abnormal. Mild to moderate mitral valve  regurgitation. No evidence of mitral stenosis.   5. Tricuspid valve regurgitation is mild to moderate.   6. The aortic valve is tricuspid. Aortic valve regurgitation is trivial.  No aortic stenosis is present.   7. The inferior vena cava is dilated in size with >50% respiratory  variability, suggesting right atrial pressure of 8 mmHg.  Patient Profile     61 y.o. male with a hx of HLD who is being seen 11/30/2021 for the evaluation of newly diagnosed atrial fibrillation with RVR and acute systolic heart failure at the request of Dr. Avon Gully.  Assessment & Plan    New onset atrial fibrillation with RVR -Duration unknown although likely a few weeks given patient's new onset shortness of breath and lower extremity edema -CHA2DS2-VASc score is 2 (CHF and HTN)  -Currently on IV heparin drip per pharmacy  -HR remains elevated in 130's at baseline and increases to 150's with movement -IV Amio and BB were stopped due to hypotension>> he dropped his systolic BP into the [id number] each time he got IV Amio bolus even over 20 minutes -K+ 3.8 yesterday -Mag was 2.2 on admission -currently in endo for TEE/DCCV -will change IV Heparin to Eliquis 5mg  BID post cardioversion   Dilated cardiomyopathy/elevated Troponin/acute systolic CHF -This is a new diagnosis for him with echo showing EF 25 to 30% with global hypokinesis -Suspect this is tachycardia mediated from his A-fib with RVR but he does have cardiac risk factors for CAD including remote history of tobacco use, hyperlipidemia -Troponin initially low but then bumped to over 500.  Suspect this is  probably related to demand ischemia in the setting of A-fib with RVR and acute CHF but also need to consider possible underlying CAD -stopped BB due to hypotension -No ACE/ARB/ARN I/MRA at this time given AKI -Hopefully if renal function improves can add GDMT with Delene Loll and Arlyce Harman  -hold SGLT2i for now due to AKI -He currently is on Lasix 40 mg IV twice daily and put out 4.4 L yesterday and is net -14L since admission -Weight decreased 15 pounds from admission -Serum creatinine was slowly improving with  diuresis but bumped yesterday (1.92 on admission>1.86> 1.75>1.69 >1.96) and cath was cancelled -suspect that his bump in SCr  was related to hypotension and not from overdiuresis >> BMET pending this am -continue lasix 40mg  IV BID for now -Continue to follow strict I's and O's, daily weights, renal function while diuresing   AKI on CKD stage IIIa -Serum creatinine 1.92 on admission and initially improved to 1.69 with diuresis but then bumped to 1.96 after hypotensive episode>>BMET pending today -His last serum creatinine I can find was 1.5 in 2015 and so therefore likely has had a diagnosis of CKD stage IIIa but not on his medical history  -Follow renal function closely with diuresis   Hyperlipidemia -LDL goal is less than 100 unless he is found to have CAD -Last LDL was 169 2015>>LDL 102 this admit -No statin for now given bump in liver enzymes   Transaminitis -Suspect related to hepatic congestion from acute CHF exacerbation>>slowly improving -We will continue to follow and hopefully will improve with diuresis  I have spent a total of 35 minutes with patient reviewing 2D echo, telemetry, EKGs, labs and examining patient as well as establishing an assessment and plan that was discussed with the patient.  > 50% of time was spent in direct patient care.        For questions or updates, please contact [facility] Please consult [email or web address] for contact info under         Signed, 2016, MD  12/03/2021, 10:21 AM

## 2021-12-03 NOTE — Anesthesia Procedure Notes (Signed)
Procedure Name: MAC Date/Time: 12/03/2021 10:34 AM  Performed by: Dorann Lodge, CRNAPre-anesthesia Checklist: Patient identified, Emergency Drugs available, Patient being monitored and Suction available Patient Re-evaluated:Patient Re-evaluated prior to induction Oxygen Delivery Method: Nasal cannula Airway Equipment and Method: Bite block Dental Injury: Teeth and Oropharynx as per pre-operative assessment

## 2021-12-03 NOTE — Interval H&P Note (Signed)
History and Physical Interval Note:  12/03/2021 10:09 AM  Adam Henson  has presented today for surgery, with the diagnosis of AFIB.  The various methods of treatment have been discussed with the patient and family. After consideration of risks, benefits and other options for treatment, the patient has consented to  Procedure(s): TRANSESOPHAGEAL ECHOCARDIOGRAM (TEE) (N/A) CARDIOVERSION (N/A) as a surgical intervention.  The patient's history has been reviewed, patient examined, no change in status, stable for surgery.  I have reviewed the patient's chart and labs.  Questions were answered to the patient's satisfaction.     Kristeen Miss

## 2021-12-04 ENCOUNTER — Inpatient Hospital Stay (HOSPITAL_COMMUNITY): Payer: BC Managed Care – PPO

## 2021-12-04 ENCOUNTER — Encounter (HOSPITAL_COMMUNITY): Payer: Self-pay | Admitting: Cardiovascular Disease

## 2021-12-04 DIAGNOSIS — N179 Acute kidney failure, unspecified: Secondary | ICD-10-CM | POA: Diagnosis not present

## 2021-12-04 DIAGNOSIS — R778 Other specified abnormalities of plasma proteins: Secondary | ICD-10-CM | POA: Diagnosis not present

## 2021-12-04 DIAGNOSIS — I4891 Unspecified atrial fibrillation: Secondary | ICD-10-CM | POA: Diagnosis not present

## 2021-12-04 DIAGNOSIS — R7989 Other specified abnormal findings of blood chemistry: Secondary | ICD-10-CM | POA: Diagnosis not present

## 2021-12-04 DIAGNOSIS — N1832 Chronic kidney disease, stage 3b: Secondary | ICD-10-CM | POA: Diagnosis not present

## 2021-12-04 LAB — BASIC METABOLIC PANEL
Anion gap: 10 (ref 5–15)
BUN: 20 mg/dL (ref 6–20)
CO2: 26 mmol/L (ref 22–32)
Calcium: 8.3 mg/dL — ABNORMAL LOW (ref 8.9–10.3)
Chloride: 100 mmol/L (ref 98–111)
Creatinine, Ser: 1.58 mg/dL — ABNORMAL HIGH (ref 0.61–1.24)
GFR, Estimated: 50 mL/min — ABNORMAL LOW (ref >60)
Glucose, Bld: 235 mg/dL — ABNORMAL HIGH (ref 70–99)
Potassium: 3.7 mmol/L (ref 3.5–5.1)
Sodium: 136 mmol/L (ref 135–145)

## 2021-12-04 LAB — MAGNESIUM: Magnesium: 2.1 mg/dL (ref 1.7–2.4)

## 2021-12-04 LAB — CBC
HCT: 43.6 % (ref 39.0–52.0)
Hemoglobin: 14.3 g/dL (ref 13.0–17.0)
MCH: 29.4 pg (ref 26.0–34.0)
MCHC: 32.8 g/dL (ref 30.0–36.0)
MCV: 89.5 fL (ref 80.0–100.0)
Platelets: 153 10*3/uL (ref 150–400)
RBC: 4.87 MIL/uL (ref 4.22–5.81)
RDW: 13.2 % (ref 11.5–15.5)
WBC: 7.6 10*3/uL (ref 4.0–10.5)
nRBC: 0 % (ref 0.0–0.2)

## 2021-12-04 LAB — HEPARIN LEVEL (UNFRACTIONATED): Heparin Unfractionated: 0.29 IU/mL — ABNORMAL LOW (ref 0.30–0.70)

## 2021-12-04 LAB — COOXEMETRY PANEL
Carboxyhemoglobin: 1.1 % (ref 0.5–1.5)
Methemoglobin: <0.7 % (ref 0.0–1.5)
O2 Saturation: 66.7 %
Total hemoglobin: 15.1 g/dL (ref 12.0–16.0)

## 2021-12-04 LAB — HEMOGLOBIN A1C
Hgb A1c MFr Bld: 5.4 % (ref 4.8–5.6)
Mean Plasma Glucose: 108.28 mg/dL

## 2021-12-04 MED ORDER — POTASSIUM CHLORIDE CRYS ER 20 MEQ PO TBCR
40.0000 meq | EXTENDED_RELEASE_TABLET | Freq: Once | ORAL | Status: AC
  Administered 2021-12-04: 40 meq via ORAL
  Filled 2021-12-04: qty 2

## 2021-12-04 MED ORDER — FUROSEMIDE 10 MG/ML IJ SOLN
40.0000 mg | Freq: Two times a day (BID) | INTRAMUSCULAR | Status: DC
  Administered 2021-12-04 (×2): 40 mg via INTRAVENOUS
  Filled 2021-12-04 (×2): qty 4

## 2021-12-04 MED ORDER — POTASSIUM CHLORIDE CRYS ER 20 MEQ PO TBCR
40.0000 meq | EXTENDED_RELEASE_TABLET | Freq: Three times a day (TID) | ORAL | Status: AC
  Administered 2021-12-04 (×2): 40 meq via ORAL
  Filled 2021-12-04 (×2): qty 2

## 2021-12-04 MED ORDER — POLYETHYLENE GLYCOL 3350 17 G PO PACK
17.0000 g | PACK | Freq: Every day | ORAL | Status: DC
  Administered 2021-12-04 – 2021-12-08 (×5): 17 g via ORAL
  Filled 2021-12-04 (×5): qty 1

## 2021-12-04 MED ORDER — DIGOXIN 125 MCG PO TABS
0.1250 mg | ORAL_TABLET | Freq: Every day | ORAL | Status: DC
  Administered 2021-12-04 – 2021-12-08 (×5): 0.125 mg via ORAL
  Filled 2021-12-04 (×5): qty 1

## 2021-12-04 MED ORDER — SENNOSIDES-DOCUSATE SODIUM 8.6-50 MG PO TABS
1.0000 | ORAL_TABLET | Freq: Two times a day (BID) | ORAL | Status: DC
  Administered 2021-12-04 – 2021-12-08 (×9): 1 via ORAL
  Filled 2021-12-04 (×10): qty 1

## 2021-12-04 MED ORDER — POLYETHYLENE GLYCOL 3350 17 G PO PACK
17.0000 g | PACK | Freq: Once | ORAL | Status: AC
  Administered 2021-12-04: 17 g via ORAL
  Filled 2021-12-04: qty 1

## 2021-12-04 NOTE — Plan of Care (Signed)
  Problem: Cardiovascular: Goal: Ability to achieve and maintain adequate cardiovascular perfusion will improve Outcome: Progressing   Problem: Activity: Goal: Ability to return to baseline activity level will improve Outcome: Progressing   Problem: Education: Goal: Knowledge of General Education information will improve Description: Including pain rating scale, medication(s)/side effects and non-pharmacologic comfort measures Outcome: Progressing

## 2021-12-04 NOTE — Progress Notes (Signed)
PROGRESS NOTE    ANUBIS BLEWITT  [id number] DOB: Jan 12, 1961 DOA: 11/28/2021 PCP: Pcp, No   Brief Narrative:   Sheriff Flavia Shipper is a pleasant 61 y.o. male who denies any significant past medical history though has not seen a physician in a few years and now presents with 3 weeks of bilateral lower extremity swelling, shortness of breath, orthopnea, and palpitations.  Assessment & Plan:   Principal Problem:   Atrial fibrillation with RVR (HCC) Active Problems:   Acute CHF (HCC)   Elevated troponin   Elevated LFTs   Stage 3b chronic kidney disease (CKD) (HCC)   AKI (acute kidney injury) (HCC)   DCM (dilated cardiomyopathy) (HCC)   Acute systolic CHF (congestive heart failure) (HCC)   Cardiogenic shock, multifactorial Secondary to below Dobutamine per cards/HF teams -appreciate insight and recommendations  New-onset atrial fibrillation with RVR, improving Mobile thrombus at left atrial appendage extending into the atrium -No improvement on diltiazem, metoprolol and could not tolerate amiodarone drip -Cardioversion on the 20th was unsuccessful due to noted thrombus, continue anticoagulation per cardiology -CHA2DS2-VASc of 1 for heart failure (?diagnosis of hypertension) - currently on heparin drip   New onset systolic heart failure, acute exacerbation -Cardiology consulted, appreciate insight and recommendations -In the setting of above with noted thrombus -Lasix ongoing per cardiology monitor closely given hypotension(on dobutamine) -Patient may benefit from Entresto/ARB/ACE pending further evaluation versus cardiac recovery  Elevated troponin  -Provoked in the setting of cardiogenic shock, new onset A-fib heart failure and cardiac thrombus  Elevated LFTs  -Again likely secondary to demand ischemia in setting of RVR and hypervolemia/congestive hepatopathy -Hepatitis HIV unremarkable INR minimally elevated at 1.4  AKI versus previously undocumented/new CKD -Distant  history creatinine 1.5 -1.9 at intake downtrending appropriately -Creatinine stable despite diuretics, likely at baseline concern for baseline CKD 2 -this would be a new diagnosis if creatinine does not recover  DVT prophylaxis: Lovenox  Code Status: Full  Family Communication: None present  Status is: Inpatient  Dispo: The patient is from: Home              Anticipated d/c is to: Home              Anticipated d/c date is: 48 to 72 hours              Patient currently not medically stable for discharge  Consultants:  None  Procedures:  None  Antimicrobials:  None  Subjective: No acute issues or events overnight, currently tolerating amiodarone and dobutamine in the ICU without any overt complaints, still somewhat anxious about his medical care as he does not fully comprehend what occurred yesterday between cardioversion in the ICU which we discussed included the discovery of a new large thrombus which increases risk of acute events.  Otherwise denies chest pain shortness of breath nausea vomiting diarrhea constipation headache fevers chills.  Objective: Vitals:   12/04/21 0200 12/04/21 0300 12/04/21 0400 12/04/21 0500  BP: 97/73 110/86 107/77 101/78  Pulse: 95 91 95 78  Resp: (!) 0 19 10 17   Temp:      TempSrc:      SpO2: 96% 94% 96% 95%  Weight:    95.3 kg  Height:        Intake/Output Summary (Last 24 hours) at 12/04/2021 0607 Last data filed at 12/04/2021 0200 Gross per 24 hour  Intake 1062.2 ml  Output 400 ml  Net 662.2 ml    Filed Weights   12/02/21 0400 12/03/21 0429  12/04/21 0500  Weight: 96.7 kg 94.1 kg 95.3 kg    Examination:  General:  Pleasantly resting in bed, No acute distress. HEENT:  Normocephalic atraumatic.  Sclerae nonicteric, noninjected.  Extraocular movements intact bilaterally. Neck:  Without mass or deformity.  Trachea is midline. Lungs:  Clear to auscultate bilaterally without rhonchi, wheeze, or rales. Heart: Irregularly irregular;  tachycardic into the 100-110s- without murmurs, rubs, or gallops. Abdomen:  Soft, nontender, nondistended.  Without guarding or rebound. Extremities: Without cyanosis, clubbing, edema, or obvious deformity. Vascular:  Dorsalis pedis and posterior tibial pulses palpable bilaterally. Skin:  Warm and dry, no erythema, no ulcerations.   Data Reviewed: I have personally reviewed following labs and imaging studies  CBC: Recent Labs  Lab 11/28/21 2126 11/29/21 0535 11/30/21 0215 12/01/21 0307 12/02/21 0406 12/03/21 0153 12/04/21 0430  WBC 11.0*   < > 9.6 9.6 9.4 9.1 7.6  NEUTROABS 7.0  --   --   --   --   --   --   HGB 16.3   < > 14.4 14.4 15.3 14.8 14.3  HCT 48.7   < > 43.6 44.0 44.7 46.8 43.6  MCV 90.2   < > 88.1 88.9 87.5 91.1 89.5  PLT 206   < > 161 167 171 179 153   < > = values in this interval not displayed.    Basic Metabolic Panel: Recent Labs  Lab 11/28/21 2126 11/29/21 0535 11/30/21 0215 12/01/21 0307 12/02/21 0406 12/03/21 1211 12/04/21 0430  NA 140   < > 134* 138 139 141 136  K 5.1   < > 3.6 3.7 3.8 3.9 3.7  CL 105   < > 103 100 100 100 100  CO2 25   < > 23 27 29  32 26  GLUCOSE 100*   < > 100* 100* 95 102* 235*  BUN 27*   < > 25* 23* 23* 19 20  CREATININE 1.92*   < > 1.75* 1.69* 1.96* 1.72* 1.58*  CALCIUM 9.0   < > 8.2* 8.2* 8.6* 9.2 8.3*  MG 2.2  --   --   --   --   --  2.1   < > = values in this interval not displayed.    GFR: Estimated Creatinine Clearance: 58.6 mL/min (A) (by C-G formula based on SCr of 1.58 mg/dL (H)). Liver Function Tests: Recent Labs  Lab 11/28/21 2126 11/29/21 0535 11/30/21 0215 12/01/21 0307 12/03/21 1211  AST 65* 51* 45* 34 36  ALT 67* 60* 59* 50* 46*  ALKPHOS 50 47 45 48 44  BILITOT 3.2* 1.9* 1.2 1.3* 1.5*  PROT 6.0* 5.4* 5.5* 5.3* 6.2*  ALBUMIN 3.6 3.1* 3.1* 2.9* 3.4*    No results for input(s): "LIPASE", "AMYLASE" in the last 168 hours. No results for input(s): "AMMONIA" in the last 168 hours. Coagulation  Profile: Recent Labs  Lab 11/29/21 0535 12/03/21 0153  INR 1.4* 1.2    Cardiac Enzymes: No results for input(s): "CKTOTAL", "CKMB", "CKMBINDEX", "TROPONINI" in the last 168 hours. BNP (last 3 results) No results for input(s): "PROBNP" in the last 8760 hours. HbA1C: Recent Labs    12/04/21 0430  HGBA1C 5.4   CBG: No results for input(s): "GLUCAP" in the last 168 hours. Lipid Profile: Recent Labs    12/02/21 0406  CHOL 152  HDL 38*  LDLCALC 102*  TRIG 59  CHOLHDL 4.0    Thyroid Function Tests: No results for input(s): "TSH", "T4TOTAL", "FREET4", "T3FREE", "THYROIDAB" in the last 72  hours.  Anemia Panel: No results for input(s): "VITAMINB12", "FOLATE", "FERRITIN", "TIBC", "IRON", "RETICCTPCT" in the last 72 hours. Sepsis Labs: Recent Labs  Lab 12/01/21 1241 12/03/21 1331 12/03/21 1639  LATICACIDVEN 1.7 1.3 1.9     No results found for this or any previous visit (from the past 240 hour(s)).       Radiology Studies: ECHO TEE  Result Date: 12/03/2021    TRANSESOPHOGEAL ECHO REPORT   Patient Name:   BRANNDON TUITE Date of Exam: 12/03/2021 Medical Rec #:  [id number]      Height:       71.0 in Accession #:    [id number]     Weight:       207.4 lb Date of Birth:  Aug 20, 1960     BSA:          2.141 m Patient Age:    60 years       BP:           127/111 mmHg Patient Gender: M              HR:           161 bpm. Exam Location:  Inpatient Procedure: Transesophageal Echo, 3D Echo, Color Doppler and Cardiac Doppler Indications:     I48.91* Unspecified atrial fibrillation  History:         Patient has prior history of Echocardiogram examinations, most                  recent 11/29/2021. CHF, Arrythmias:Atrial Fibrillation; Risk                  Factors:Dyslipidemia.  Sonographer:     Irving Burton Senior RDCS Referring Phys:  [id number] Deloris Ping NAHSER Diagnosing Phys: Kristeen Miss MD PROCEDURE: After discussion of the risks and benefits of a TEE, an informed consent was obtained from the  patient. The transesophogeal probe was passed without difficulty through the esophogus of the patient. Sedation performed by different physician. The patient was monitored while under deep sedation. Anesthestetic sedation was provided intravenously by Anesthesiology: 110mg  of Propofol, 100mg  of Lidocaine. The patient developed no complications during the procedure. IMPRESSIONS  1. Left ventricular ejection fraction, by estimation, is <20%. The left ventricle has severely decreased function. The left ventricle demonstrates global hypokinesis. The left ventricular internal cavity size was severely dilated.  2. Right ventricular systolic function is moderately reduced. The right ventricular size is moderately enlarged.  3. There is a large, mobile thrombus adherant to the side wall of the LAA . The tip of the thrombus extends out to the main left atrium. . Left atrial size was severely dilated. A left atrial/left atrial appendage thrombus was detected.  4. Right atrial size was moderately dilated.  5. There is no reversal of flow in the pulmonary veins. . The mitral valve is grossly normal. Moderate mitral valve regurgitation.  6. The aortic valve is normal in structure. Aortic valve regurgitation is not visualized. No aortic stenosis is present.  7. Evidence of atrial level shunting detected by color flow Doppler. FINDINGS  Left Ventricle: Left ventricular ejection fraction, by estimation, is <20%. The left ventricle has severely decreased function. The left ventricle demonstrates global hypokinesis. The left ventricular internal cavity size was severely dilated. Right Ventricle: The right ventricular size is moderately enlarged. Right vetricular wall thickness was not well visualized. Right ventricular systolic function is moderately reduced. Left Atrium: There is a large, mobile thrombus adherant to the side wall of the  LAA . The tip of the thrombus extends out to the main left atrium. Left atrial size was severely  dilated. A left atrial/left atrial appendage thrombus was detected. Right Atrium: Right atrial size was moderately dilated. Pericardium: There is no evidence of pericardial effusion. Mitral Valve: There is no reversal of flow in the pulmonary veins. The mitral valve is grossly normal. Moderate mitral valve regurgitation. Tricuspid Valve: The tricuspid valve is normal in structure. Tricuspid valve regurgitation is mild. Aortic Valve: The aortic valve is normal in structure. Aortic valve regurgitation is not visualized. No aortic stenosis is present. Pulmonic Valve: The pulmonic valve was normal in structure. Pulmonic valve regurgitation is trivial. Aorta: The aortic root and ascending aorta are structurally normal, with no evidence of dilitation. IAS/Shunts: Evidence of atrial level shunting detected by color flow Doppler. There is a small PFO by color doppler with left to right flow. Mertie Moores MD Electronically signed by Mertie Moores MD Signature Date/Time: 12/03/2021/5:52:13 PM    Final    [place] EKG SITE RITE  Result Date: 12/03/2021 If Site Rite image not attached, placement could not be confirmed due to current cardiac rhythm.   Scheduled Meds:  amiodarone  150 mg Intravenous Once   atorvastatin  40 mg Oral Daily   Chlorhexidine Gluconate Cloth  6 each Topical Daily   feeding supplement  237 mL Oral BID BM   multivitamin with minerals  1 tablet Oral Daily   sodium chloride flush  10-40 mL Intracatheter Q12H   sodium chloride flush  3 mL Intravenous Q12H   Continuous Infusions:  amiodarone 30 mg/hr (12/04/21 0200)   DOBUTamine 2.5 mcg/kg/min (12/04/21 0200)   heparin 1,350 Units/hr (12/04/21 0200)   norepinephrine (LEVOPHED) Adult infusion       LOS: 6 days   Time spent: 37min  Lyllie Cobbins C Ermina Oberman, DO [facility]  If 7PM-7AM, please contact night-coverage [email or web address]  12/04/2021, 6:07 AM

## 2021-12-04 NOTE — Progress Notes (Addendum)
Advanced Heart Failure Rounding Note  PCP-Cardiologist: None   Subjective:    06/20: DBA added, Amio gtt started for AF with RVR  CO-OX 53% last night on 2.5 DBA. Check CO-OX STAT.  CVP 10.   Scr improving 1.96>1.72>1.58  Remains on Amio gtt at 30/hr. Continues in AF 110s-120s, ~ 10 PVCs/min. HR goes up with light activity.  No dyspnea, orthopnea or PND. Appetite okay.    Objective:   Weight Range: 95.3 kg Body mass index is 29.3 kg/m.   Vital Signs:   Temp:  [97.7 F (36.5 C)-98.5 F (36.9 C)] 97.7 F (36.5 C) (06/21 0000) Pulse Rate:  [67-148] 81 (06/21 0600) Resp:  [0-30] 10 (06/21 0600) BP: (82-155)/(60-141) 109/81 (06/21 0600) SpO2:  [92 %-100 %] 97 % (06/21 0600) Weight:  [95.3 kg] 95.3 kg (06/21 0500) Last BM Date : 11/30/21  Weight change: Filed Weights   12/02/21 0400 12/03/21 0429 12/04/21 0500  Weight: 96.7 kg 94.1 kg 95.3 kg    Intake/Output:   Intake/Output Summary (Last 24 hours) at 12/04/2021 0744 Last data filed at 12/04/2021 0600 Gross per 24 hour  Intake 1196.79 ml  Output 700 ml  Net 496.79 ml      Physical Exam    General:  No distress. Sitting up in bed. HEENT: Normal Neck: Supple. JVP 10-12. Carotids 2+ bilat; no bruits.  Cor: PMI nondisplaced. Irregular rate & rhythm. No rubs, gallops or murmurs. Lungs: Clear Abdomen: Soft, nontender, nondistended. Extremities: No cyanosis, clubbing, rash, 1+ edema, + RUE PICC Neuro: Alert & orientedx3, cranial nerves grossly intact. moves all 4 extremities w/o difficulty. Affect pleasant   Telemetry   Atrial fibrillation 110s-120s  Labs    CBC Recent Labs    12/03/21 0153 12/04/21 0430  WBC 9.1 7.6  HGB 14.8 14.3  HCT 46.8 43.6  MCV 91.1 89.5  PLT 179 0000000   Basic Metabolic Panel Recent Labs    12/03/21 1211 12/04/21 0430  NA 141 136  K 3.9 3.7  CL 100 100  CO2 32 26  GLUCOSE 102* 235*  BUN 19 20  CREATININE 1.72* 1.58*  CALCIUM 9.2 8.3*  MG  --  2.1   Liver  Function Tests Recent Labs    12/03/21 1211  AST 36  ALT 46*  ALKPHOS 44  BILITOT 1.5*  PROT 6.2*  ALBUMIN 3.4*   No results for input(s): "LIPASE", "AMYLASE" in the last 72 hours. Cardiac Enzymes No results for input(s): "CKTOTAL", "CKMB", "CKMBINDEX", "TROPONINI" in the last 72 hours.  BNP: BNP (last 3 results) Recent Labs    11/28/21 2126  BNP 639.5*    ProBNP (last 3 results) No results for input(s): "PROBNP" in the last 8760 hours.   D-Dimer No results for input(s): "DDIMER" in the last 72 hours. Hemoglobin A1C Recent Labs    12/04/21 0430  HGBA1C 5.4   Fasting Lipid Panel Recent Labs    12/02/21 0406  CHOL 152  HDL 38*  LDLCALC 102*  TRIG 59  CHOLHDL 4.0   Thyroid Function Tests No results for input(s): "TSH", "T4TOTAL", "T3FREE", "THYROIDAB" in the last 72 hours.  Invalid input(s): "FREET3"  Other results:   Imaging    ECHO TEE  Result Date: 12/03/2021    TRANSESOPHOGEAL ECHO REPORT   Patient Name:   Adam Henson Date of Exam: 12/03/2021 Medical Rec #:  [id number]      Height:       71.0 in Accession #:  [id number]     Weight:       207.4 lb Date of Birth:  August 01, 1960     BSA:          2.141 m Patient Age:    60 years       BP:           127/111 mmHg Patient Gender: M              HR:           161 bpm. Exam Location:  Inpatient Procedure: Transesophageal Echo, 3D Echo, Color Doppler and Cardiac Doppler Indications:     I48.91* Unspecified atrial fibrillation  History:         Patient has prior history of Echocardiogram examinations, most                  recent 11/29/2021. CHF, Arrythmias:Atrial Fibrillation; Risk                  Factors:Dyslipidemia.  Sonographer:     Raquel Sarna Senior RDCS Referring Phys:  [place] Diagnosing Phys: Mertie Moores MD PROCEDURE: After discussion of the risks and benefits of a TEE, an informed consent was obtained from the patient. The transesophogeal probe was passed without difficulty through the esophogus  of the patient. Sedation performed by different physician. The patient was monitored while under deep sedation. Anesthestetic sedation was provided intravenously by Anesthesiology: 110mg  of Propofol, 100mg  of Lidocaine. The patient developed no complications during the procedure. IMPRESSIONS  1. Left ventricular ejection fraction, by estimation, is <20%. The left ventricle has severely decreased function. The left ventricle demonstrates global hypokinesis. The left ventricular internal cavity size was severely dilated.  2. Right ventricular systolic function is moderately reduced. The right ventricular size is moderately enlarged.  3. There is a large, mobile thrombus adherant to the side wall of the LAA . The tip of the thrombus extends out to the main left atrium. . Left atrial size was severely dilated. A left atrial/left atrial appendage thrombus was detected.  4. Right atrial size was moderately dilated.  5. There is no reversal of flow in the pulmonary veins. . The mitral valve is grossly normal. Moderate mitral valve regurgitation.  6. The aortic valve is normal in structure. Aortic valve regurgitation is not visualized. No aortic stenosis is present.  7. Evidence of atrial level shunting detected by color flow Doppler. FINDINGS  Left Ventricle: Left ventricular ejection fraction, by estimation, is <20%. The left ventricle has severely decreased function. The left ventricle demonstrates global hypokinesis. The left ventricular internal cavity size was severely dilated. Right Ventricle: The right ventricular size is moderately enlarged. Right vetricular wall thickness was not well visualized. Right ventricular systolic function is moderately reduced. Left Atrium: There is a large, mobile thrombus adherant to the side wall of the LAA . The tip of the thrombus extends out to the main left atrium. Left atrial size was severely dilated. A left atrial/left atrial appendage thrombus was detected. Right Atrium: Right  atrial size was moderately dilated. Pericardium: There is no evidence of pericardial effusion. Mitral Valve: There is no reversal of flow in the pulmonary veins. The mitral valve is grossly normal. Moderate mitral valve regurgitation. Tricuspid Valve: The tricuspid valve is normal in structure. Tricuspid valve regurgitation is mild. Aortic Valve: The aortic valve is normal in structure. Aortic valve regurgitation is not visualized. No aortic stenosis is present. Pulmonic Valve: The pulmonic valve was normal in structure. Pulmonic  valve regurgitation is trivial. Aorta: The aortic root and ascending aorta are structurally normal, with no evidence of dilitation. IAS/Shunts: Evidence of atrial level shunting detected by color flow Doppler. There is a small PFO by color doppler with left to right flow. Kristeen Miss MD Electronically signed by Kristeen Miss MD Signature Date/Time: 12/03/2021/5:52:13 PM    Final    [place] EKG SITE RITE  Result Date: 12/03/2021 If Site Rite image not attached, placement could not be confirmed due to current cardiac rhythm.    Medications:     Scheduled Medications:  atorvastatin  40 mg Oral Daily   Chlorhexidine Gluconate Cloth  6 each Topical Daily   feeding supplement  237 mL Oral BID BM   multivitamin with minerals  1 tablet Oral Daily   potassium chloride  40 mEq Oral Q8H   sodium chloride flush  10-40 mL Intracatheter Q12H   sodium chloride flush  3 mL Intravenous Q12H    Infusions:  amiodarone 30 mg/hr (12/04/21 0600)   DOBUTamine 2.5 mcg/kg/min (12/04/21 0600)   heparin 1,450 Units/hr (12/04/21 0722)   norepinephrine (LEVOPHED) Adult infusion      PRN Medications: acetaminophen, guaiFENesin-dextromethorphan, ondansetron (ZOFRAN) IV, sodium chloride flush    Patient Profile   61 y.o. male with no significant medical history other than hyperlipidemia and possible untreated HTN. Has not sought routine medical care over the last few years. Admitted with Afib  with RVR and acute systolic CHF (new) >>>cardiogenic shock.   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 Suspect tachy-mediated, admitted with AF with RVR. HS troponin up 52>573. Likely d/t demand ischemia but eventually need to r/o CAD. Has remote history of tobacco use. Eventual R/LHC, possibly 06/22 or 06/23. -Became acutely hypotensive after sedation for TEE 06/20 requiring IV neo to maintain BP.  -Continue 2.5 DBA. Check CO-OX. -CVP 10. Give 40 mg lasix IV BID today. Had good diuresis with this dose earlier this admit. -Add digoxin 0.125 -GDMT as tolerated. BP soft. -Outpatient sleep study   Atrial fibrillation with RVR -New this admit -Rate better controlled but still 110s-120, increase amio gtt to 60/hr -Cardioversion aborted. Large LAA thrombus on echo -Continue heparin gtt   AKI on CKD 3a -Baseline uncertain, Scr 1.5 in 2015 -Scr this admit 1.9>1.75>1.7>1.96>1.72>1.58 -In setting of shock  PVCs -~10/min -In setting of DBA -Continue amio gtt as above -Keep K > 4 and Mag > 2   Length of Stay: 6  FINCH, LINDSAY N, PA-C  12/04/2021, 7:44 AM  Advanced Heart Failure Team Pager [phone] (M-F; 7a - 5p)  Please contact [facility] for night-coverage after hours (5p -7a ) and weekends on [email or web address]   Patient seen and examined with the above-signed Advanced Practice Provider and/or Housestaff. I personally reviewed laboratory data, imaging studies and relevant notes. I independently examined the patient and formulated the important aspects of the plan. I have edited the note to reflect any of my changes or salient points. I have personally discussed the plan with the patient and/or family.  Feels better today. Remains on DBA 2.5. Co-ox now 67% CVP 10. Remains in AF but rate  somewhat improved on IV amio. (V rates 110-120s). On heparin.   Denies SOB, orthopnea or PND  General:  Sitting up in bed  No resp difficulty  HEENT: normal Neck: supple. JVP 10 . Carotids 2+ bilat; no bruits. No lymphadenopathy or thryomegaly appreciated. Cor: PMI nondisplaced. irregular rate & rhythm. No rubs, gallops or murmurs. Lungs: clear Abdomen: soft, nontender, nondistended. No hepatosplenomegaly. No bruits or masses. Good bowel sounds. Extremities: no cyanosis, clubbing, rash, edema Neuro: alert & orientedx3, cranial nerves grossly intact. moves all 4 extremities w/o difficulty. Affect pleasant  Much improved with inotrope support. Will continue DBA, amio and heparin. Give 1 dose IV lasix. Possibly cath tomorrow. Hopefully AF rates will continue to improve as he is not candidate for DC-CV currently with LAA clot.   Arvilla Meres, MD  3:32 PM

## 2021-12-04 NOTE — Progress Notes (Signed)
ANTICOAGULATION CONSULT NOTE  Pharmacy Consult for heparin Indication: atrial fibrillation  No Known Allergies  Patient Measurements: Height: 5\' 11"  (180.3 cm) Weight: 95.3 kg (210 lb 1.6 oz) IBW/kg (Calculated) : 75.3 Heparin Dosing Weight: 96.2 kg  Vital Signs: Temp: 97.7 F (36.5 C) (06/21 0000) Temp Source: Oral (06/21 0000) BP: 101/78 (06/21 0500) Pulse Rate: 78 (06/21 0500)  Labs: Recent Labs    12/02/21 0406 12/03/21 0153 12/03/21 1211 12/03/21 1331 12/04/21 0430  HGB 15.3 14.8  --   --  14.3  HCT 44.7 46.8  --   --  43.6  PLT 171 179  --   --  153  LABPROT  --  15.2  --   --   --   INR  --  1.2  --   --   --   HEPARINUNFRC 0.31 0.28*  --  0.39 0.29*  CREATININE 1.96*  --  1.72*  --  1.58*     Estimated Creatinine Clearance: 58.6 mL/min (A) (by C-G formula based on SCr of 1.58 mg/dL (H)).   Medical History: Past Medical History:  Diagnosis Date   Hyperlipidemia       Scheduled:   atorvastatin  40 mg Oral Daily   Chlorhexidine Gluconate Cloth  6 each Topical Daily   feeding supplement  237 mL Oral BID BM   multivitamin with minerals  1 tablet Oral Daily   sodium chloride flush  10-40 mL Intracatheter Q12H   sodium chloride flush  3 mL Intravenous Q12H   Infusions:   amiodarone 30 mg/hr (12/04/21 0200)   DOBUTamine 2.5 mcg/kg/min (12/04/21 0200)   heparin 1,350 Units/hr (12/04/21 0200)   norepinephrine (LEVOPHED) Adult infusion      Assessment: Pt is a 61 yo male who presents with new onset afib and heart failure. Pharmacy consutled to dose heparin. CBC WNL. Patient is not on anticoagulation PTA.   Heparin level 0.29 on recheck this morning. Cbc within normal limits. No bleeding issues noted.   Goal of Therapy:  Heparin level 0.3-0.7 units/ml Monitor platelets by anticoagulation protocol: Yes    Plan:  Increase Heparin to 1450 units/hr Daily heparin level and cbc  Thank you, 67 PharmD., BCPS Clinical Pharmacist 12/04/2021  6:38 AM

## 2021-12-04 NOTE — Anesthesia Postprocedure Evaluation (Signed)
Anesthesia Post Note  Patient: Savian C Willers  Procedure(s) Performed: TRANSESOPHAGEAL ECHOCARDIOGRAM (TEE)     Patient location during evaluation: PACU Anesthesia Type: MAC Level of consciousness: awake and alert Pain management: pain level controlled Vital Signs Assessment: post-procedure vital signs reviewed and stable Respiratory status: spontaneous breathing, nonlabored ventilation and respiratory function stable Cardiovascular status: blood pressure returned to baseline and stable Postop Assessment: no apparent nausea or vomiting Anesthetic complications: no   No notable events documented.  Last Vitals:  Vitals:   12/04/21 0600 12/04/21 0818  BP: 109/81   Pulse: 81   Resp: 10   Temp:  36.7 C  SpO2: 97%     Last Pain:  Vitals:   12/04/21 0818  TempSrc: Oral  PainSc:                  Lynda Rainwater

## 2021-12-05 ENCOUNTER — Encounter (HOSPITAL_COMMUNITY): Payer: Self-pay | Admitting: Internal Medicine

## 2021-12-05 ENCOUNTER — Encounter (HOSPITAL_COMMUNITY)
Admission: EM | Disposition: A | Discharge status: Home / Self Care | Payer: Self-pay | Source: Home / Self Care | Attending: Internal Medicine

## 2021-12-05 DIAGNOSIS — I5021 Acute systolic (congestive) heart failure: Secondary | ICD-10-CM | POA: Diagnosis not present

## 2021-12-05 DIAGNOSIS — I4891 Unspecified atrial fibrillation: Secondary | ICD-10-CM | POA: Diagnosis not present

## 2021-12-05 DIAGNOSIS — R7989 Other specified abnormal findings of blood chemistry: Secondary | ICD-10-CM | POA: Diagnosis not present

## 2021-12-05 DIAGNOSIS — N179 Acute kidney failure, unspecified: Secondary | ICD-10-CM | POA: Diagnosis not present

## 2021-12-05 DIAGNOSIS — N1832 Chronic kidney disease, stage 3b: Secondary | ICD-10-CM | POA: Diagnosis not present

## 2021-12-05 HISTORY — PX: RIGHT/LEFT HEART CATH AND CORONARY ANGIOGRAPHY: CATH118266

## 2021-12-05 LAB — POCT I-STAT 7, (LYTES, BLD GAS, ICA,H+H)
Acid-Base Excess: 2 mmol/L (ref 0.0–2.0)
Bicarbonate: 25.7 mmol/L (ref 20.0–28.0)
Calcium, Ion: 1.19 mmol/L (ref 1.15–1.40)
HCT: 48 % (ref 39.0–52.0)
Hemoglobin: 16.3 g/dL (ref 13.0–17.0)
O2 Saturation: 93 %
Potassium: 4.3 mmol/L (ref 3.5–5.1)
Sodium: 136 mmol/L (ref 135–145)
TCO2: 27 mmol/L (ref 22–32)
pCO2 arterial: 36.2 mmHg (ref 32–48)
pH, Arterial: 7.46 — ABNORMAL HIGH (ref 7.35–7.45)
pO2, Arterial: 63 mmHg — ABNORMAL LOW (ref 83–108)

## 2021-12-05 LAB — BASIC METABOLIC PANEL
Anion gap: 8 (ref 5–15)
BUN: 16 mg/dL (ref 6–20)
CO2: 27 mmol/L (ref 22–32)
Calcium: 8 mg/dL — ABNORMAL LOW (ref 8.9–10.3)
Chloride: 98 mmol/L (ref 98–111)
Creatinine, Ser: 1.74 mg/dL — ABNORMAL HIGH (ref 0.61–1.24)
GFR, Estimated: 44 mL/min — ABNORMAL LOW (ref >60)
Glucose, Bld: 206 mg/dL — ABNORMAL HIGH (ref 70–99)
Potassium: 3.9 mmol/L (ref 3.5–5.1)
Sodium: 133 mmol/L — ABNORMAL LOW (ref 135–145)

## 2021-12-05 LAB — CBC
HCT: 43.9 % (ref 39.0–52.0)
Hemoglobin: 14.4 g/dL (ref 13.0–17.0)
MCH: 29.1 pg (ref 26.0–34.0)
MCHC: 32.8 g/dL (ref 30.0–36.0)
MCV: 88.7 fL (ref 80.0–100.0)
Platelets: 152 10*3/uL (ref 150–400)
RBC: 4.95 MIL/uL (ref 4.22–5.81)
RDW: 13.2 % (ref 11.5–15.5)
WBC: 8.8 10*3/uL (ref 4.0–10.5)
nRBC: 0 % (ref 0.0–0.2)

## 2021-12-05 LAB — POCT I-STAT EG7
Acid-Base Excess: 1 mmol/L (ref 0.0–2.0)
Acid-Base Excess: 1 mmol/L (ref 0.0–2.0)
Bicarbonate: 25.2 mmol/L (ref 20.0–28.0)
Bicarbonate: 25.9 mmol/L (ref 20.0–28.0)
Calcium, Ion: 1.03 mmol/L — ABNORMAL LOW (ref 1.15–1.40)
Calcium, Ion: 1.16 mmol/L (ref 1.15–1.40)
HCT: 44 % (ref 39.0–52.0)
HCT: 47 % (ref 39.0–52.0)
Hemoglobin: 15 g/dL (ref 13.0–17.0)
Hemoglobin: 16 g/dL (ref 13.0–17.0)
O2 Saturation: 66 %
O2 Saturation: 68 %
Potassium: 3.7 mmol/L (ref 3.5–5.1)
Potassium: 4.2 mmol/L (ref 3.5–5.1)
Sodium: 137 mmol/L (ref 135–145)
Sodium: 140 mmol/L (ref 135–145)
TCO2: 26 mmol/L (ref 22–32)
TCO2: 27 mmol/L (ref 22–32)
pCO2, Ven: 38.4 mmHg — ABNORMAL LOW (ref 44–60)
pCO2, Ven: 39.7 mmHg — ABNORMAL LOW (ref 44–60)
pH, Ven: 7.423 (ref 7.25–7.43)
pH, Ven: 7.425 (ref 7.25–7.43)
pO2, Ven: 33 mmHg (ref 32–45)
pO2, Ven: 35 mmHg (ref 32–45)

## 2021-12-05 LAB — GLUCOSE, CAPILLARY
Glucose-Capillary: 135 mg/dL — ABNORMAL HIGH (ref 70–99)
Glucose-Capillary: 124 mg/dL — ABNORMAL HIGH (ref 70–99)
Glucose-Capillary: 97 mg/dL (ref 70–99)
Glucose-Capillary: 129 mg/dL — ABNORMAL HIGH (ref 70–99)

## 2021-12-05 LAB — COOXEMETRY PANEL
Carboxyhemoglobin: 1.1 % (ref 0.5–1.5)
Methemoglobin: <0.7 % (ref 0.0–1.5)
O2 Saturation: 69.7 %
Total hemoglobin: 14.7 g/dL (ref 12.0–16.0)

## 2021-12-05 LAB — HEPARIN LEVEL (UNFRACTIONATED): Heparin Unfractionated: 0.61 IU/mL (ref 0.30–0.70)

## 2021-12-05 SURGERY — RIGHT/LEFT HEART CATH AND CORONARY ANGIOGRAPHY
Anesthesia: LOCAL

## 2021-12-05 MED ORDER — MIDAZOLAM HCL 2 MG/2ML IJ SOLN
INTRAMUSCULAR | Status: DC | PRN
  Administered 2021-12-05: 1 mg via INTRAVENOUS

## 2021-12-05 MED ORDER — APIXABAN 5 MG PO TABS
5.0000 mg | ORAL_TABLET | Freq: Two times a day (BID) | ORAL | Status: DC
  Administered 2021-12-05 – 2021-12-08 (×6): 5 mg via ORAL
  Filled 2021-12-05 (×6): qty 1

## 2021-12-05 MED ORDER — SODIUM CHLORIDE 0.9 % IV SOLN: INTRAVENOUS | Status: DC

## 2021-12-05 MED ORDER — INSULIN ASPART 100 UNIT/ML IJ SOLN
0.0000 [IU] | Freq: Three times a day (TID) | INTRAMUSCULAR | Status: DC
  Administered 2021-12-05 – 2021-12-06 (×3): 1 [IU] via SUBCUTANEOUS

## 2021-12-05 MED ORDER — SODIUM CHLORIDE 0.9% FLUSH: 3.0000 mL | INTRAVENOUS | Status: DC | PRN

## 2021-12-05 MED ORDER — SODIUM CHLORIDE 0.9% FLUSH: 3.0000 mL | Freq: Two times a day (BID) | INTRAVENOUS | Status: DC

## 2021-12-05 MED ORDER — SODIUM CHLORIDE 0.9% FLUSH
3.0000 mL | Freq: Two times a day (BID) | INTRAVENOUS | Status: DC
  Administered 2021-12-07 – 2021-12-08 (×2): 3 mL via INTRAVENOUS

## 2021-12-05 MED ORDER — IOHEXOL 350 MG/ML SOLN
INTRAVENOUS | Status: DC | PRN
  Administered 2021-12-05: 20 mL

## 2021-12-05 MED ORDER — AMIODARONE HCL IN DEXTROSE 360-4.14 MG/200ML-% IV SOLN
INTRAVENOUS | Status: AC
  Filled 2021-12-05: qty 200

## 2021-12-05 MED ORDER — HEPARIN SODIUM (PORCINE) 1000 UNIT/ML IJ SOLN
INTRAMUSCULAR | Status: DC | PRN
  Administered 2021-12-05: 5000 [IU] via INTRAVENOUS

## 2021-12-05 MED ORDER — LIDOCAINE HCL (PF) 1 % IJ SOLN
INTRAMUSCULAR | Status: DC | PRN
  Administered 2021-12-05 (×2): 2 mL via SUBCUTANEOUS

## 2021-12-05 MED ORDER — ONDANSETRON HCL 4 MG/2ML IJ SOLN: 4.0000 mg | Freq: Four times a day (QID) | INTRAMUSCULAR | Status: DC | PRN

## 2021-12-05 MED ORDER — HEPARIN (PORCINE) IN NACL 1000-0.9 UT/500ML-% IV SOLN
INTRAVENOUS | Status: DC | PRN
  Administered 2021-12-05 (×2): 500 mL

## 2021-12-05 MED ORDER — VERAPAMIL HCL 2.5 MG/ML IV SOLN
INTRAVENOUS | Status: DC | PRN
  Administered 2021-12-05: 10 mL via INTRA_ARTERIAL

## 2021-12-05 MED ORDER — SODIUM CHLORIDE 0.9 % IV SOLN
250.0000 mL | INTRAVENOUS | Status: DC | PRN
Start: 2021-12-05 — End: 2021-12-05

## 2021-12-05 MED ORDER — MIDAZOLAM HCL 2 MG/2ML IJ SOLN
INTRAMUSCULAR | Status: AC
  Filled 2021-12-05: qty 2

## 2021-12-05 MED ORDER — HYDRALAZINE HCL 20 MG/ML IJ SOLN: 10.0000 mg | INTRAMUSCULAR | Status: AC | PRN

## 2021-12-05 MED ORDER — HEPARIN (PORCINE) IN NACL 1000-0.9 UT/500ML-% IV SOLN
INTRAVENOUS | Status: AC
  Filled 2021-12-05: qty 1000

## 2021-12-05 MED ORDER — FENTANYL CITRATE (PF) 100 MCG/2ML IJ SOLN
INTRAMUSCULAR | Status: DC | PRN
  Administered 2021-12-05: 25 ug via INTRAVENOUS

## 2021-12-05 MED ORDER — ENSURE ENLIVE PO LIQD
237.0000 mL | Freq: Every day | ORAL | Status: DC
Start: 2021-12-06 — End: 2021-12-08
  Administered 2021-12-06 – 2021-12-07 (×2): 237 mL via ORAL

## 2021-12-05 MED ORDER — HEPARIN SODIUM (PORCINE) 1000 UNIT/ML IJ SOLN
INTRAMUSCULAR | Status: AC
  Filled 2021-12-05: qty 10

## 2021-12-05 MED ORDER — VERAPAMIL HCL 2.5 MG/ML IV SOLN
INTRAVENOUS | Status: AC
  Filled 2021-12-05: qty 2

## 2021-12-05 MED ORDER — LABETALOL HCL 5 MG/ML IV SOLN: 10.0000 mg | INTRAVENOUS | Status: AC | PRN

## 2021-12-05 MED ORDER — SODIUM CHLORIDE 0.9 % IV SOLN: 250.0000 mL | INTRAVENOUS | Status: DC | PRN

## 2021-12-05 MED ORDER — FENTANYL CITRATE (PF) 100 MCG/2ML IJ SOLN
INTRAMUSCULAR | Status: AC
  Filled 2021-12-05: qty 2

## 2021-12-05 MED ORDER — ASPIRIN 81 MG PO CHEW
81.0000 mg | CHEWABLE_TABLET | ORAL | Status: AC
  Administered 2021-12-05: 81 mg via ORAL
  Filled 2021-12-05: qty 1

## 2021-12-05 MED ORDER — ACETAMINOPHEN 325 MG PO TABS: 650.0000 mg | ORAL_TABLET | ORAL | Status: DC | PRN

## 2021-12-05 MED ORDER — LIDOCAINE HCL (PF) 1 % IJ SOLN
INTRAMUSCULAR | Status: AC
  Filled 2021-12-05: qty 30

## 2021-12-05 SURGICAL SUPPLY — 11 items
BAND CMPR LRG ZPHR (HEMOSTASIS) ×1
BAND ZEPHYR COMPRESS 30 LONG (HEMOSTASIS) ×1 IMPLANT
CATH 5FR JL3.5 JR4 ANG PIG MP (CATHETERS) ×1 IMPLANT
CATH BALLN WEDGE 5F 110CM (CATHETERS) ×1 IMPLANT
GLIDESHEATH SLEND SS 6F .021 (SHEATH) ×1 IMPLANT
GUIDEWIRE .025 260CM (WIRE) ×1 IMPLANT
GUIDEWIRE INQWIRE 1.5J.035X260 (WIRE) IMPLANT
INQWIRE 1.5J .035X260CM (WIRE) ×2
PACK CARDIAC CATHETERIZATION (CUSTOM PROCEDURE TRAY) ×3 IMPLANT
SHEATH GLIDE SLENDER 4/5FR (SHEATH) ×1 IMPLANT
TRANSDUCER W/STOPCOCK (MISCELLANEOUS) ×3 IMPLANT

## 2021-12-05 NOTE — Progress Notes (Signed)
CARDIAC REHAB PHASE I   Went to walk pt pt ambulating in hall several times already today  Began HF education today reviewed daily weights monitoring salt intake, medication compliance, balanced activity, and symptom awareness pt had appropriate questions  appears eager to learn and comply with treatment plans pt waiting on cath today will continue to follow  1050-1146  Woodroe Chen, RN BSN 12/05/2021 11:41 AM

## 2021-12-05 NOTE — Progress Notes (Signed)
ANTICOAGULATION CONSULT NOTE  Pharmacy Consult for heparin Indication: atrial fibrillation  No Known Allergies  Patient Measurements: Height: 5\' 11"  (180.3 cm) Weight: 94.6 kg (208 lb 8.9 oz) IBW/kg (Calculated) : 75.3 Heparin Dosing Weight: 96.2 kg  Vital Signs: Temp: 98.9 F (37.2 C) (06/22 0000) Temp Source: Oral (06/22 0000) BP: 89/78 (06/22 0600) Pulse Rate: 114 (06/22 0700)  Labs: Recent Labs    12/03/21 0153 12/03/21 1211 12/03/21 1331 12/04/21 0430 12/05/21 0412  HGB 14.8  --   --  14.3 14.4  HCT 46.8  --   --  43.6 43.9  PLT 179  --   --  153 152  LABPROT 15.2  --   --   --   --   INR 1.2  --   --   --   --   HEPARINUNFRC 0.28*  --  0.39 0.29* 0.61  CREATININE  --  1.72*  --  1.58* 1.74*     Estimated Creatinine Clearance: 53 mL/min (A) (by C-G formula based on SCr of 1.74 mg/dL (H)).   Medical History: Past Medical History:  Diagnosis Date   Hyperlipidemia       Scheduled:   atorvastatin  40 mg Oral Daily   Chlorhexidine Gluconate Cloth  6 each Topical Daily   digoxin  0.125 mg Oral Daily   feeding supplement  237 mL Oral BID BM   insulin aspart  0-9 Units Subcutaneous TID WC   multivitamin with minerals  1 tablet Oral Daily   polyethylene glycol  17 g Oral Daily   senna-docusate  1 tablet Oral BID   sodium chloride flush  10-40 mL Intracatheter Q12H   sodium chloride flush  3 mL Intravenous Q12H   Infusions:   amiodarone 60 mg/hr (12/05/21 0700)   DOBUTamine 2.5 mcg/kg/min (12/05/21 0700)   heparin 1,450 Units/hr (12/05/21 0700)   norepinephrine (LEVOPHED) Adult infusion      Assessment: Pt is a 60 yo male who presents with new onset afib and heart failure. Pharmacy consutled to dose heparin. Patient was not on anticoagulation PTA.   Heparin level 0.61 on recheck this morning. Cbc within normal limits. No bleeding issues noted.   Goal of Therapy:  Heparin level 0.3-0.7 units/ml Monitor platelets by anticoagulation protocol: Yes    Plan:  Continue heparin at 1450 units/hr Daily heparin level and cbc  Thank you, 67 PharmD., BCPS Clinical Pharmacist 12/05/2021 7:48 AM

## 2021-12-05 NOTE — Progress Notes (Signed)
PROGRESS NOTE    Adam Henson  DJM:[id number] DOB: Sep 29, 1960 DOA: 11/28/2021 PCP: Pcp, No   Brief Narrative:   Adam Henson is a pleasant 61 y.o. male who denies any significant past medical history though has not seen a physician in a few years and now presents with 3 weeks of bilateral lower extremity swelling, shortness of breath, orthopnea, and palpitations.  Assessment & Plan:   Principal Problem:   Atrial fibrillation with RVR (HCC) Active Problems:   Acute CHF (HCC)   Elevated troponin   Elevated LFTs   Stage 3b chronic kidney disease (CKD) (HCC)   AKI (acute kidney injury) (HCC)   DCM (dilated cardiomyopathy) (HCC)   Acute systolic CHF (congestive heart failure) (HCC)  Cardiogenic shock, multifactorial Secondary to below Dobutamine per cards/HF teams -appreciate insight and recommendations Levophed initially ordered and hung but never needed to start.  Cardiac cath pending later this afternoon 6/22  New-onset atrial fibrillation with RVR, improving Mobile thrombus at left atrial appendage extending into the atrium -No improvement on diltiazem, metoprolol and could not tolerate amiodarone drip initially given hypotension -Cardioversion on the 20th was unsuccessful due to noted thrombus, continue anticoagulation per cardiology -Continue amiodarone drip and dobutamine as below per cardiology -CHA2DS2-VASc of 1 for heart failure (?diagnosis of hypertension) - currently on heparin drip   New onset systolic heart failure, acute exacerbation -Cardiology consulted, appreciate insight and recommendations -In the setting of above with noted thrombus -Diuretics on hold in the setting of hypotension, currently requiring dobutamine -Amiodarone ongoing for rate control as above  Right upper extremity swelling  -Peripheral IV withdrawn, likely infiltrate, ultrasound negative for abscess or fluid collection. -Generally proving, continue to follow clinically, elevate limb as  possible  Elevated troponin  -Provoked in the setting of cardiogenic shock, new onset A-fib heart failure and cardiac thrombus  Elevated LFTs  -Again likely secondary to demand ischemia in setting of RVR and hypervolemia/congestive hepatopathy -Hepatitis HIV unremarkable INR minimally elevated at 1.4  AKI versus previously undocumented/new CKD -Distant history creatinine 1.5 -1.9 at intake downtrending appropriately -Creatinine stable despite diuretics, likely at baseline concern for baseline CKD 2 -this would be a new diagnosis if creatinine does not recover  Constipation, resolved Continue to follow clinically  DVT prophylaxis: Lovenox  Code Status: Full  Family Communication: None present  Status is: Inpatient  Dispo: The patient is from: Home              Anticipated d/c is to: Home              Anticipated d/c date is: 48 to 72 hours pending clinical improvement and cardiac evaluation              Patient currently not medically stable for discharge  Consultants:  None  Procedures:  None  Antimicrobials:  None  Subjective: No acute issues or events overnight, currently tolerating amiodarone and dobutamine in the ICU without any overt complaints, still somewhat anxious about his medical care as he does not fully comprehend what occurred yesterday between cardioversion in the ICU which we discussed included the discovery of a new large thrombus which increases risk of acute events.  Otherwise denies chest pain shortness of breath nausea vomiting diarrhea constipation headache fevers chills.  Objective: Vitals:   12/05/21 0505 12/05/21 0515 12/05/21 0600 12/05/21 0700  BP: 92/79 (!) 86/72 (!) 89/78   Pulse: 87 77 (!) 110 (!) 114  Resp: (!) 21 19 (!) 21   Temp:  TempSrc:      SpO2: 94% 93% 93% 98%  Weight:   94.6 kg   Height:   5\' 11"  (1.803 m)     Intake/Output Summary (Last 24 hours) at 12/05/2021 0728 Last data filed at 12/05/2021 0700 Gross per 24 hour   Intake 1783.81 ml  Output 4730 ml  Net -2946.19 ml    Filed Weights   12/03/21 0429 12/04/21 0500 12/05/21 0600  Weight: 94.1 kg 95.3 kg 94.6 kg    Examination:  General:  Pleasantly resting in bed, No acute distress. HEENT:  Normocephalic atraumatic.  Sclerae nonicteric, noninjected.  Extraocular movements intact bilaterally. Neck:  Without mass or deformity.  Trachea is midline. Lungs:  Clear to auscultate bilaterally without rhonchi, wheeze, or rales. Heart: Irregularly irregular; tachycardic into the 100-110s- without murmurs, rubs, or gallops. Abdomen:  Soft, nontender, nondistended.  Without guarding or rebound. Extremities: Without cyanosis, clubbing, edema, or obvious deformity. Vascular:  Dorsalis pedis and posterior tibial pulses palpable bilaterally. Skin:  Warm and dry, no erythema, no ulcerations.   Data Reviewed: I have personally reviewed following labs and imaging studies  CBC: Recent Labs  Lab 11/28/21 2126 11/29/21 0535 12/01/21 0307 12/02/21 0406 12/03/21 0153 12/04/21 0430 12/05/21 0412  WBC 11.0*   < > 9.6 9.4 9.1 7.6 8.8  NEUTROABS 7.0  --   --   --   --   --   --   HGB 16.3   < > 14.4 15.3 14.8 14.3 14.4  HCT 48.7   < > 44.0 44.7 46.8 43.6 43.9  MCV 90.2   < > 88.9 87.5 91.1 89.5 88.7  PLT 206   < > 167 171 179 153 152   < > = values in this interval not displayed.    Basic Metabolic Panel: Recent Labs  Lab 11/28/21 2126 11/29/21 0535 12/01/21 0307 12/02/21 0406 12/03/21 1211 12/04/21 0430 12/05/21 0412  NA 140   < > 138 139 141 136 133*  K 5.1   < > 3.7 3.8 3.9 3.7 3.9  CL 105   < > 100 100 100 100 98  CO2 25   < > 27 29 32 26 27  GLUCOSE 100*   < > 100* 95 102* 235* 206*  BUN 27*   < > 23* 23* 19 20 16   CREATININE 1.92*   < > 1.69* 1.96* 1.72* 1.58* 1.74*  CALCIUM 9.0   < > 8.2* 8.6* 9.2 8.3* 8.0*  MG 2.2  --   --   --   --  2.1  --    < > = values in this interval not displayed.    GFR: Estimated Creatinine Clearance: 53  mL/min (A) (by C-G formula based on SCr of 1.74 mg/dL (H)). Liver Function Tests: Recent Labs  Lab 11/28/21 2126 11/29/21 0535 11/30/21 0215 12/01/21 0307 12/03/21 1211  AST 65* 51* 45* 34 36  ALT 67* 60* 59* 50* 46*  ALKPHOS 50 47 45 48 44  BILITOT 3.2* 1.9* 1.2 1.3* 1.5*  PROT 6.0* 5.4* 5.5* 5.3* 6.2*  ALBUMIN 3.6 3.1* 3.1* 2.9* 3.4*    No results for input(s): "LIPASE", "AMYLASE" in the last 168 hours. No results for input(s): "AMMONIA" in the last 168 hours. Coagulation Profile: Recent Labs  Lab 11/29/21 0535 12/03/21 0153  INR 1.4* 1.2    Cardiac Enzymes: No results for input(s): "CKTOTAL", "CKMB", "CKMBINDEX", "TROPONINI" in the last 168 hours. BNP (last 3 results) No results for input(s): "PROBNP" in  the last 8760 hours. HbA1C: Recent Labs    12/04/21 0430  HGBA1C 5.4    CBG: No results for input(s): "GLUCAP" in the last 168 hours. Lipid Profile: No results for input(s): "CHOL", "HDL", "LDLCALC", "TRIG", "CHOLHDL", "LDLDIRECT" in the last 72 hours.  Thyroid Function Tests: No results for input(s): "TSH", "T4TOTAL", "FREET4", "T3FREE", "THYROIDAB" in the last 72 hours.  Anemia Panel: No results for input(s): "VITAMINB12", "FOLATE", "FERRITIN", "TIBC", "IRON", "RETICCTPCT" in the last 72 hours. Sepsis Labs: Recent Labs  Lab 12/01/21 1241 12/03/21 1331 12/03/21 1639  LATICACIDVEN 1.7 1.3 1.9     No results found for this or any previous visit (from the past 240 hour(s)).       Radiology Studies: [place] RT UPPER EXTREM LTD SOFT TISSUE NON VASCULAR  Result Date: 12/04/2021 CLINICAL DATA:  Rule out abscess. EXAM: ULTRASOUND RIGHT UPPER EXTREMITY LIMITED TECHNIQUE: Ultrasound examination of the upper extremity soft tissues was performed in the area of clinical concern. COMPARISON:  None Available. FINDINGS: Examination is limited due to overlying bandages. Scanning was performed around bandages in the right upper arm. No focal fluid collection is seen in  the region of concern. A central catheter is noted in the right upper extremity 5 cm below armpit. IMPRESSION: Limited evaluation due to overlying bandages. No focal fluid collection or abscess is seen. Electronically Signed   By: Brett Fairy M.D.   On: 12/04/2021 22:51   ECHO TEE  Result Date: 12/03/2021    TRANSESOPHOGEAL ECHO REPORT   Patient Name:   Adam Henson Date of Exam: 12/03/2021 Medical Rec #:  [id number]      Height:       71.0 in Accession #:    [id number]     Weight:       207.4 lb Date of Birth:  Mar 06, 1961     BSA:          2.141 m Patient Age:    9 years       BP:           127/111 mmHg Patient Gender: M              HR:           161 bpm. Exam Location:  Inpatient Procedure: Transesophageal Echo, 3D Echo, Color Doppler and Cardiac Doppler Indications:     I48.91* Unspecified atrial fibrillation  History:         Patient has prior history of Echocardiogram examinations, most                  recent 11/29/2021. CHF, Arrythmias:Atrial Fibrillation; Risk                  Factors:Dyslipidemia.  Sonographer:     Raquel Sarna Senior RDCS Referring Phys:  [place] Diagnosing Phys: Mertie Moores MD PROCEDURE: After discussion of the risks and benefits of a TEE, an informed consent was obtained from the patient. The transesophogeal probe was passed without difficulty through the esophogus of the patient. Sedation performed by different physician. The patient was monitored while under deep sedation. Anesthestetic sedation was provided intravenously by Anesthesiology: 110mg  of Propofol, 100mg  of Lidocaine. The patient developed no complications during the procedure. IMPRESSIONS  1. Left ventricular ejection fraction, by estimation, is <20%. The left ventricle has severely decreased function. The left ventricle demonstrates global hypokinesis. The left ventricular internal cavity size was severely dilated.  2. Right ventricular systolic function is moderately reduced. The right ventricular size  is  moderately enlarged.  3. There is a large, mobile thrombus adherant to the side wall of the LAA . The tip of the thrombus extends out to the main left atrium. . Left atrial size was severely dilated. A left atrial/left atrial appendage thrombus was detected.  4. Right atrial size was moderately dilated.  5. There is no reversal of flow in the pulmonary veins. . The mitral valve is grossly normal. Moderate mitral valve regurgitation.  6. The aortic valve is normal in structure. Aortic valve regurgitation is not visualized. No aortic stenosis is present.  7. Evidence of atrial level shunting detected by color flow Doppler. FINDINGS  Left Ventricle: Left ventricular ejection fraction, by estimation, is <20%. The left ventricle has severely decreased function. The left ventricle demonstrates global hypokinesis. The left ventricular internal cavity size was severely dilated. Right Ventricle: The right ventricular size is moderately enlarged. Right vetricular wall thickness was not well visualized. Right ventricular systolic function is moderately reduced. Left Atrium: There is a large, mobile thrombus adherant to the side wall of the LAA . The tip of the thrombus extends out to the main left atrium. Left atrial size was severely dilated. A left atrial/left atrial appendage thrombus was detected. Right Atrium: Right atrial size was moderately dilated. Pericardium: There is no evidence of pericardial effusion. Mitral Valve: There is no reversal of flow in the pulmonary veins. The mitral valve is grossly normal. Moderate mitral valve regurgitation. Tricuspid Valve: The tricuspid valve is normal in structure. Tricuspid valve regurgitation is mild. Aortic Valve: The aortic valve is normal in structure. Aortic valve regurgitation is not visualized. No aortic stenosis is present. Pulmonic Valve: The pulmonic valve was normal in structure. Pulmonic valve regurgitation is trivial. Aorta: The aortic root and ascending aorta are  structurally normal, with no evidence of dilitation. IAS/Shunts: Evidence of atrial level shunting detected by color flow Doppler. There is a small PFO by color doppler with left to right flow. Mertie Moores MD Electronically signed by Mertie Moores MD Signature Date/Time: 12/03/2021/5:52:13 PM    Final    [place] EKG SITE RITE  Result Date: 12/03/2021 If Site Rite image not attached, placement could not be confirmed due to current cardiac rhythm.   Scheduled Meds:  atorvastatin  40 mg Oral Daily   Chlorhexidine Gluconate Cloth  6 each Topical Daily   digoxin  0.125 mg Oral Daily   feeding supplement  237 mL Oral BID BM   insulin aspart  0-9 Units Subcutaneous TID WC   multivitamin with minerals  1 tablet Oral Daily   polyethylene glycol  17 g Oral Daily   senna-docusate  1 tablet Oral BID   sodium chloride flush  10-40 mL Intracatheter Q12H   sodium chloride flush  3 mL Intravenous Q12H   Continuous Infusions:  amiodarone 60 mg/hr (12/05/21 0700)   DOBUTamine 2.5 mcg/kg/min (12/05/21 0700)   heparin 1,450 Units/hr (12/05/21 0700)   norepinephrine (LEVOPHED) Adult infusion       LOS: 7 days   Time spent: 69min  Chanz Cahall C Georgie Eduardo, DO [facility]  If 7PM-7AM, please contact night-coverage [email or web address]  12/05/2021, 7:28 AM

## 2021-12-05 NOTE — Progress Notes (Signed)
Nutrition Follow-up  DOCUMENTATION CODES:   Obesity unspecified  INTERVENTION:   Continue MVI with Minerals  Decrease Ensure Enlive po tp daily, each supplement provides 350 kcal and 20 grams of protein.  NUTRITION DIAGNOSIS:   Increased nutrient needs related to acute illness as evidenced by estimated needs.  Being addressed  GOAL:   Patient will meet greater than or equal to 90% of their needs  Met  MONITOR:   PO intake, Supplement acceptance  REASON FOR ASSESSMENT:   Malnutrition Screening Tool    ASSESSMENT:   Pt with no significant past medical history (has not seen a physician in a few years) presents with 3 weeks of bilateral lower extremity swelling, shortness of breath, orthopnea, and palpitations.   NPO for R/L heart cath today REmains on dobutamine, amiodarone gtt  Recorded po intake 100% of meals. Noted 0% recorded as well but this was when pt was NPO.   Pt taking 0-1 Ensure per day; given good appetite, RD to decrease to 1 per day  Current wt: 94.6 kg  Admit wt: 101.1 kg Net negative 16 L per I/o flow sheet  Labs: sodium 133 (L), HGbA1c  5.4 (wdl) Meds: ss novolog, MVI with Minerals,digoxin, Kcl, miralax  Diet Order:   Diet Order             Diet NPO time specified  Diet effective now                   EDUCATION NEEDS:   No education needs have been identified at this time  Skin:  Skin Assessment: Reviewed RN Assessment  Last BM:  6/21  Height:   Ht Readings from Last 1 Encounters:  12/05/21 '5\' 11"'  (1.803 m)    Weight:   Wt Readings from Last 1 Encounters:  12/05/21 94.6 kg    Ideal Body Weight:  78.2 kg  BMI:  Body mass index is 29.09 kg/m.  Estimated Nutritional Needs:   Kcal:  2150-2350  Protein:  105-120 grams  Fluid:  > 2 L   Kerman Passey MS, RDN, LDN, CNSC Registered Dietitian III Clinical Nutrition RD Pager and On-Call Pager Number Located in [employer]

## 2021-12-06 ENCOUNTER — Inpatient Hospital Stay (HOSPITAL_COMMUNITY): Payer: BC Managed Care – PPO

## 2021-12-06 ENCOUNTER — Other Ambulatory Visit (HOSPITAL_COMMUNITY): Payer: Self-pay

## 2021-12-06 DIAGNOSIS — N1832 Chronic kidney disease, stage 3b: Secondary | ICD-10-CM | POA: Diagnosis not present

## 2021-12-06 DIAGNOSIS — I509 Heart failure, unspecified: Secondary | ICD-10-CM

## 2021-12-06 DIAGNOSIS — I4891 Unspecified atrial fibrillation: Secondary | ICD-10-CM | POA: Diagnosis not present

## 2021-12-06 DIAGNOSIS — N179 Acute kidney failure, unspecified: Secondary | ICD-10-CM | POA: Diagnosis not present

## 2021-12-06 DIAGNOSIS — R7989 Other specified abnormal findings of blood chemistry: Secondary | ICD-10-CM | POA: Diagnosis not present

## 2021-12-06 DIAGNOSIS — I513 Intracardiac thrombosis, not elsewhere classified: Secondary | ICD-10-CM

## 2021-12-06 DIAGNOSIS — E871 Hypo-osmolality and hyponatremia: Secondary | ICD-10-CM

## 2021-12-06 DIAGNOSIS — I493 Ventricular premature depolarization: Secondary | ICD-10-CM

## 2021-12-06 DIAGNOSIS — D696 Thrombocytopenia, unspecified: Secondary | ICD-10-CM

## 2021-12-06 LAB — GLUCOSE, CAPILLARY
Glucose-Capillary: 128 mg/dL — ABNORMAL HIGH (ref 70–99)
Glucose-Capillary: 106 mg/dL — ABNORMAL HIGH (ref 70–99)
Glucose-Capillary: 97 mg/dL (ref 70–99)
Glucose-Capillary: 97 mg/dL (ref 70–99)

## 2021-12-06 LAB — BASIC METABOLIC PANEL
Anion gap: 14 (ref 5–15)
BUN: 18 mg/dL (ref 6–20)
CO2: 22 mmol/L (ref 22–32)
Calcium: 8.2 mg/dL — ABNORMAL LOW (ref 8.9–10.3)
Chloride: 97 mmol/L — ABNORMAL LOW (ref 98–111)
Creatinine, Ser: 1.6 mg/dL — ABNORMAL HIGH (ref 0.61–1.24)
GFR, Estimated: 49 mL/min — ABNORMAL LOW (ref >60)
Glucose, Bld: 242 mg/dL — ABNORMAL HIGH (ref 70–99)
Potassium: 4.1 mmol/L (ref 3.5–5.1)
Sodium: 133 mmol/L — ABNORMAL LOW (ref 135–145)

## 2021-12-06 LAB — COOXEMETRY PANEL
Carboxyhemoglobin: 1.8 % — ABNORMAL HIGH (ref 0.5–1.5)
Methemoglobin: <0.7 % (ref 0.0–1.5)
O2 Saturation: 71.8 %
Total hemoglobin: 14 g/dL (ref 12.0–16.0)

## 2021-12-06 LAB — MAGNESIUM: Magnesium: 2 mg/dL (ref 1.7–2.4)

## 2021-12-06 LAB — CBC
HCT: 42.1 % (ref 39.0–52.0)
Hemoglobin: 13.8 g/dL (ref 13.0–17.0)
MCH: 29.6 pg (ref 26.0–34.0)
MCHC: 32.8 g/dL (ref 30.0–36.0)
MCV: 90.1 fL (ref 80.0–100.0)
Platelets: 127 10*3/uL — ABNORMAL LOW (ref 150–400)
RBC: 4.67 MIL/uL (ref 4.22–5.81)
RDW: 13.2 % (ref 11.5–15.5)
WBC: 8.4 10*3/uL (ref 4.0–10.5)
nRBC: 0 % (ref 0.0–0.2)

## 2021-12-06 MED ORDER — DAPAGLIFLOZIN PROPANEDIOL 10 MG PO TABS
10.0000 mg | ORAL_TABLET | Freq: Every day | ORAL | 11 refills | Status: DC
  Filled 2021-12-06: qty 30, 30d supply, fill #0

## 2021-12-06 MED ORDER — POTASSIUM CHLORIDE CRYS ER 20 MEQ PO TBCR
20.0000 meq | EXTENDED_RELEASE_TABLET | Freq: Two times a day (BID) | ORAL | Status: AC
Start: 2021-12-06 — End: 2021-12-06
  Administered 2021-12-06 (×2): 20 meq via ORAL
  Filled 2021-12-06 (×2): qty 1

## 2021-12-06 MED ORDER — GADOBUTROL 1 MMOL/ML IV SOLN
10.0000 mL | Freq: Once | INTRAVENOUS | Status: AC | PRN
  Administered 2021-12-06: 10 mL via INTRAVENOUS

## 2021-12-06 MED ORDER — FUROSEMIDE 10 MG/ML IJ SOLN
40.0000 mg | Freq: Two times a day (BID) | INTRAMUSCULAR | Status: DC
  Administered 2021-12-06 (×2): 40 mg via INTRAVENOUS
  Filled 2021-12-06 (×2): qty 4

## 2021-12-06 MED ORDER — DIGOXIN 125 MCG PO TABS
0.1250 mg | ORAL_TABLET | Freq: Every day | ORAL | 11 refills | Status: DC
  Filled 2021-12-06: qty 30, 30d supply, fill #0

## 2021-12-06 MED ORDER — ATORVASTATIN CALCIUM 40 MG PO TABS
40.0000 mg | ORAL_TABLET | Freq: Every day | ORAL | 11 refills | Status: DC
  Filled 2021-12-06: qty 30, 30d supply, fill #0

## 2021-12-06 MED ORDER — APIXABAN 5 MG PO TABS
5.0000 mg | ORAL_TABLET | Freq: Two times a day (BID) | ORAL | 11 refills | Status: DC
  Filled 2021-12-06: qty 60, 30d supply, fill #0

## 2021-12-06 MED ORDER — FUROSEMIDE 40 MG PO TABS
40.0000 mg | ORAL_TABLET | Freq: Two times a day (BID) | ORAL | 11 refills | Status: DC
  Filled 2021-12-06: qty 60, 30d supply, fill #0

## 2021-12-06 MED ORDER — AMIODARONE HCL 200 MG PO TABS
200.0000 mg | ORAL_TABLET | Freq: Two times a day (BID) | ORAL | 1 refills | Status: DC
  Filled 2021-12-06: qty 60, 30d supply, fill #0

## 2021-12-06 NOTE — TOC Initial Note (Addendum)
Transition of Care [facility]) - Initial/Assessment Note    Patient Details  Name: Adam Henson MRN: [id number] Date of Birth: 01/25/1961  [facility] CM/SW Contact:    Elliot Cousin, RN Phone Number: [phone] 12/06/2021, 3:48 PM  Clinical Narrative:                  HF TOC CM spoke to pt at bedside. Pt has scale at home. States he adheres to healthy low sodium diet prior to hospital stay. Pt states he has started disability and FMLA. Provided pt with contact to assist with paperwork. Provided pt with Eliquis and Farxiga copay card. Pt meds are in [facility] pharmacy for weekend dc. Pt states he will locate a PCP. His PCP retired from [employer]. Explained to pt he can go to the [employer] website to locate a PCP.     Expected Discharge Plan: Home/Self Care Barriers to Discharge: No Barriers Identified   Patient Goals and CMS Choice Patient states their goals for this hospitalization and ongoing recovery are:: wants to remain independent and get back to baseline      Expected Discharge Plan and Services Expected Discharge Plan: Home/Self Care   Discharge Planning Services: CM Consult   Prior Living Arrangements/Services     Patient language and need for interpreter reviewed:: Yes Do you feel safe going back to the place where you live?: Yes      Need for Family Participation in Patient Care: No (Comment) Care giver support system in place?: No (comment)   Criminal Activity/Legal Involvement Pertinent to Current Situation/Hospitalization: No - Comment as needed  Activities of Daily Living Home Assistive Devices/Equipment: None ADL Screening (condition at time of admission) Patient's cognitive ability adequate to safely complete daily activities?: Yes Is the patient deaf or have difficulty hearing?: No Does the patient have difficulty seeing, even when wearing glasses/contacts?: No Does the patient have difficulty concentrating, remembering, or making  decisions?: No Patient able to express need for assistance with ADLs?: Yes Does the patient have difficulty dressing or bathing?: No Independently performs ADLs?: Yes (appropriate for developmental age) Does the patient have difficulty walking or climbing stairs?: No Weakness of Legs: None Weakness of Arms/Hands: None  Permission Sought/Granted Permission sought to share information with : Case Manager, Family Supports, PCP Permission granted to share information with : Yes, Verbal Permission Granted  Share Information with NAME: Charlton Haws     Permission granted to share info w Relationship: sister  Permission granted to share info w Contact Information: [phone]  Emotional Assessment Appearance:: Appears stated age Attitude/Demeanor/Rapport: Engaged Affect (typically observed): Accepting Orientation: : Oriented to Self, Oriented to Place, Oriented to  Time, Oriented to Situation   Psych Involvement: No (comment)  Admission diagnosis:  Atrial fibrillation with RVR (HCC) [I48.91] Patient Active Problem List   Diagnosis Date Noted   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,  [place] - [address] [place] [address] Phone: [phone] Fax: [phone]  [name] [address] [place] [address] Phone: [phone] Fax:  [phone]     Social Determinants of Health (SDOH) Interventions    Readmission Risk Interventions     No data to display

## 2021-12-06 NOTE — Plan of Care (Signed)
  Problem: Education: Goal: Knowledge of General Education information will improve Description: Including pain rating scale, medication(s)/side effects and non-pharmacologic comfort measures Outcome: Progressing   Problem: Health Behavior/Discharge Planning: Goal: Ability to manage health-related needs will improve Outcome: Progressing   Problem: Clinical Measurements: Goal: Ability to maintain clinical measurements within normal limits will improve Outcome: Progressing Goal: Will remain free from infection Outcome: Progressing Goal: Diagnostic test results will improve Outcome: Progressing Goal: Respiratory complications will improve Outcome: Progressing Goal: Cardiovascular complication will be avoided Outcome: Progressing   Problem: Activity: Goal: Risk for activity intolerance will decrease Outcome: Progressing   Problem: Nutrition: Goal: Adequate nutrition will be maintained Outcome: Progressing   Problem: Coping: Goal: Level of anxiety will decrease Outcome: Progressing   Problem: Elimination: Goal: Will not experience complications related to bowel motility Outcome: Progressing Goal: Will not experience complications related to urinary retention Outcome: Progressing   Problem: Pain Managment: Goal: General experience of comfort will improve Outcome: Progressing   Problem: Safety: Goal: Ability to remain free from injury will improve Outcome: Progressing   Problem: Skin Integrity: Goal: Risk for impaired skin integrity will decrease Outcome: Progressing   Problem: Education: Goal: Understanding of CV disease, CV risk reduction, and recovery process will improve Outcome: Progressing Goal: Individualized Educational Video(s) Outcome: Progressing   Problem: Activity: Goal: Ability to return to baseline activity level will improve Outcome: Progressing   Problem: Cardiovascular: Goal: Ability to achieve and maintain adequate cardiovascular perfusion  will improve Outcome: Progressing Goal: Vascular access site(s) Level 0-1 will be maintained Outcome: Progressing   Problem: Health Behavior/Discharge Planning: Goal: Ability to safely manage health-related needs after discharge will improve Outcome: Progressing   Problem: Education: Goal: Understanding of CV disease, CV risk reduction, and recovery process will improve Outcome: Progressing Goal: Individualized Educational Video(s) Outcome: Progressing

## 2021-12-07 DIAGNOSIS — D696 Thrombocytopenia, unspecified: Secondary | ICD-10-CM | POA: Diagnosis not present

## 2021-12-07 DIAGNOSIS — I5021 Acute systolic (congestive) heart failure: Secondary | ICD-10-CM | POA: Diagnosis not present

## 2021-12-07 DIAGNOSIS — R7989 Other specified abnormal findings of blood chemistry: Secondary | ICD-10-CM | POA: Diagnosis not present

## 2021-12-07 DIAGNOSIS — R778 Other specified abnormalities of plasma proteins: Secondary | ICD-10-CM | POA: Diagnosis not present

## 2021-12-07 DIAGNOSIS — N1832 Chronic kidney disease, stage 3b: Secondary | ICD-10-CM | POA: Diagnosis not present

## 2021-12-07 DIAGNOSIS — I4891 Unspecified atrial fibrillation: Secondary | ICD-10-CM | POA: Diagnosis not present

## 2021-12-07 LAB — GLUCOSE, CAPILLARY
Glucose-Capillary: 104 mg/dL — ABNORMAL HIGH (ref 70–99)
Glucose-Capillary: 108 mg/dL — ABNORMAL HIGH (ref 70–99)
Glucose-Capillary: 96 mg/dL (ref 70–99)
Glucose-Capillary: 97 mg/dL (ref 70–99)

## 2021-12-07 LAB — BASIC METABOLIC PANEL
Anion gap: 11 (ref 5–15)
BUN: 22 mg/dL — ABNORMAL HIGH (ref 6–20)
CO2: 26 mmol/L (ref 22–32)
Calcium: 8 mg/dL — ABNORMAL LOW (ref 8.9–10.3)
Chloride: 96 mmol/L — ABNORMAL LOW (ref 98–111)
Creatinine, Ser: 1.68 mg/dL — ABNORMAL HIGH (ref 0.61–1.24)
GFR, Estimated: 46 mL/min — ABNORMAL LOW (ref >60)
Glucose, Bld: 233 mg/dL — ABNORMAL HIGH (ref 70–99)
Potassium: 3.7 mmol/L (ref 3.5–5.1)
Sodium: 133 mmol/L — ABNORMAL LOW (ref 135–145)

## 2021-12-07 LAB — COOXEMETRY PANEL
Carboxyhemoglobin: 1.4 % (ref 0.5–1.5)
Methemoglobin: <0.7 % (ref 0.0–1.5)
O2 Saturation: 77 %
Total hemoglobin: 13.9 g/dL (ref 12.0–16.0)

## 2021-12-07 LAB — CBC
HCT: 41 % (ref 39.0–52.0)
Hemoglobin: 13.3 g/dL (ref 13.0–17.0)
MCH: 28.7 pg (ref 26.0–34.0)
MCHC: 32.4 g/dL (ref 30.0–36.0)
MCV: 88.4 fL (ref 80.0–100.0)
Platelets: 121 10*3/uL — ABNORMAL LOW (ref 150–400)
RBC: 4.64 MIL/uL (ref 4.22–5.81)
RDW: 13 % (ref 11.5–15.5)
WBC: 7.8 10*3/uL (ref 4.0–10.5)
nRBC: 0 % (ref 0.0–0.2)

## 2021-12-07 LAB — MAGNESIUM: Magnesium: 2.1 mg/dL (ref 1.7–2.4)

## 2021-12-07 LAB — LIPOPROTEIN A (LPA): Lipoprotein (a): 185.1 nmol/L — ABNORMAL HIGH (ref <75.0)

## 2021-12-07 MED ORDER — AMIODARONE HCL 200 MG PO TABS
200.0000 mg | ORAL_TABLET | Freq: Two times a day (BID) | ORAL | Status: DC
  Administered 2021-12-07 – 2021-12-08 (×3): 200 mg via ORAL
  Filled 2021-12-07 (×3): qty 1

## 2021-12-07 MED ORDER — EMPAGLIFLOZIN 10 MG PO TABS
10.0000 mg | ORAL_TABLET | Freq: Every day | ORAL | Status: DC
  Administered 2021-12-07 – 2021-12-08 (×2): 10 mg via ORAL
  Filled 2021-12-07 (×2): qty 1

## 2021-12-07 MED ORDER — SPIRONOLACTONE 12.5 MG HALF TABLET
12.5000 mg | ORAL_TABLET | Freq: Every day | ORAL | Status: DC
  Administered 2021-12-07 – 2021-12-08 (×2): 12.5 mg via ORAL
  Filled 2021-12-07 (×2): qty 1

## 2021-12-08 DIAGNOSIS — R570 Cardiogenic shock: Secondary | ICD-10-CM | POA: Diagnosis not present

## 2021-12-08 DIAGNOSIS — I4891 Unspecified atrial fibrillation: Secondary | ICD-10-CM | POA: Diagnosis not present

## 2021-12-08 DIAGNOSIS — N179 Acute kidney failure, unspecified: Secondary | ICD-10-CM | POA: Diagnosis not present

## 2021-12-08 DIAGNOSIS — I248 Other forms of acute ischemic heart disease: Secondary | ICD-10-CM

## 2021-12-08 DIAGNOSIS — I513 Intracardiac thrombosis, not elsewhere classified: Secondary | ICD-10-CM

## 2021-12-08 DIAGNOSIS — E669 Obesity, unspecified: Secondary | ICD-10-CM

## 2021-12-08 DIAGNOSIS — I5021 Acute systolic (congestive) heart failure: Secondary | ICD-10-CM | POA: Diagnosis not present

## 2021-12-08 DIAGNOSIS — N1831 Chronic kidney disease, stage 3a: Secondary | ICD-10-CM

## 2021-12-08 LAB — GLUCOSE, CAPILLARY
Glucose-Capillary: 91 mg/dL (ref 70–99)
Glucose-Capillary: 90 mg/dL (ref 70–99)

## 2021-12-08 LAB — COOXEMETRY PANEL
Carboxyhemoglobin: 2 % — ABNORMAL HIGH (ref 0.5–1.5)
Methemoglobin: <0.7 % (ref 0.0–1.5)
O2 Saturation: 76.7 %
Total hemoglobin: 14.7 g/dL (ref 12.0–16.0)

## 2021-12-08 LAB — CBC
HCT: 43.8 % (ref 39.0–52.0)
Hemoglobin: 14.7 g/dL (ref 13.0–17.0)
MCH: 29.8 pg (ref 26.0–34.0)
MCHC: 33.6 g/dL (ref 30.0–36.0)
MCV: 88.8 fL (ref 80.0–100.0)
Platelets: 151 10*3/uL (ref 150–400)
RBC: 4.93 MIL/uL (ref 4.22–5.81)
RDW: 12.8 % (ref 11.5–15.5)
WBC: 8.9 10*3/uL (ref 4.0–10.5)
nRBC: 0 % (ref 0.0–0.2)

## 2021-12-08 LAB — BASIC METABOLIC PANEL
Anion gap: 8 (ref 5–15)
BUN: 19 mg/dL (ref 6–20)
CO2: 26 mmol/L (ref 22–32)
Calcium: 8.6 mg/dL — ABNORMAL LOW (ref 8.9–10.3)
Chloride: 102 mmol/L (ref 98–111)
Creatinine, Ser: 1.68 mg/dL — ABNORMAL HIGH (ref 0.61–1.24)
GFR, Estimated: 46 mL/min — ABNORMAL LOW (ref >60)
Glucose, Bld: 89 mg/dL (ref 70–99)
Potassium: 4.4 mmol/L (ref 3.5–5.1)
Sodium: 136 mmol/L (ref 135–145)

## 2021-12-08 MED ORDER — SPIRONOLACTONE 25 MG PO TABS: 12.5000 mg | ORAL_TABLET | Freq: Every day | ORAL | 11 refills | Status: DC

## 2021-12-08 MED ORDER — FUROSEMIDE 40 MG PO TABS
40.0000 mg | ORAL_TABLET | Freq: Every day | ORAL | Status: DC | PRN
Start: 2021-12-09 — End: 2021-12-08
  Filled 2021-12-08 (×2): qty 1

## 2021-12-08 MED ORDER — FUROSEMIDE 40 MG PO TABS: 40.0000 mg | ORAL_TABLET | Freq: Every day | ORAL | Status: DC | PRN

## 2021-12-08 NOTE — Discharge Summary (Signed)
Physician Discharge Summary   Patient: Adam Henson MRN: [id number] DOB: [birth year]  Admit date:     11/28/2021  Discharge date: 12/08/21  Discharge Physician: Brendia Sacks   PCP: Pcp, No   Recommendations at discharge:   New diagnosis systolic CHF, afib, LAA thrombus Suspected CKD stage IIIa  Discharge Diagnoses: Principal Problem:   Atrial fibrillation with RVR (HCC) Active Problems:   Elevated LFTs   Acute renal failure superimposed on stage 3a chronic kidney disease (HCC)   Acute systolic CHF (congestive heart failure) (HCC)   Frequent PVCs   Thrombus of left atrial appendage   Obesity   Cardiogenic shock (HCC)   Demand ischemia (HCC)  Resolved Problems:   Hyponatremia   Thrombocytopenia [facility])  Hospital Course: 61 year old man no significant PMH reported, presented with several week history of lower extremity edema, shortness of breath, orthopnea and palpitations.  Found to have new onset atrial fibrillation with RVR, LVEF 25-30% with global hypokinesis.  Seen by advanced heart failure and managed for cardiogenic shock, acute systolic CHF. Plan DCCV on 20th failed, atrial thrombus noted.  Also had RUE swelling soft tissue ultrasound no fluid collection or abscess, 6/22 underwent RHC/LHC with normal coronaries elevated filling pressures with normal cardiac output, EF less than 25% suspected to be tachycardia mediated heart failure.  Condition gradually improved, weaned off advanced therapies and transition to oral therapy.  Cleared for discharge by advanced heart failure Dr. Gala Romney.  Individual issues as below.  Acute systolic CHF,new onset, s/p cardiogenic shock, thought tachy-mediated, admitted with afib RVR; with associated demand ischemia. Elevated transaminases and mild hyponatremia likely secondary to CHF. --acute components resolved; off infusions and IV meds --continue digoxin, spironolactone and Faxiga. BP too low for further titration of GDMT currently.    Atrial fibrillation with RVR with LAA thrombus: --New diagnosis, cardioversion aborted, large LAA thrombus on echo.  Chemically converted with amiodarone --continue amiodarone and apixaban   AKI superimposed on stage 3a chronic kidney disease: --baseline unclear, last checked 2015, 1.5; suspect at baseline; acute component likely resolved; secondary to cardiogenic shock   Thrombocytopenia: --resolved  Body mass index is 27.7 kg/m.      Consultants:  Cardiology Advanced heart failure  Procedures performed:  PICC TEE Left Ventrical:  severe LV dysfunction .   EF < 20%.    Mitral Valve: moderate MR ,  There is no reversal of flow in the PV .    Aortic Valve: 3 leaflet valve.  No AS, no AI    Tricuspid Valve: mild TR    Pulmonic Valve: trace PI    Left Atrium/ Left atrial appendage: There is a large thrombus that originates in the LAA that protrudes out into the LA.   It appears to be attached to the appendage wall with a thin stalk .  Moderately mobile.    Atrial septum: + PFO with Left to right shunting    Aorta: normal .      Complications: No apparent complications Patient did tolerate procedure well.   Cardioversion was not performed due to the large LAA thrombus.   Disposition: Home Diet recommendation:  Discharge Diet Orders (From admission, onward)     Start     Ordered   12/08/21 0000  Diet - low sodium heart healthy        12/08/21 1318           Cardiac diet DISCHARGE MEDICATION: Allergies as of 12/08/2021   No Known Allergies  Medication List     STOP taking these medications    BLACK CURRANT SEED OIL PO   ELDERBERRY PO       TAKE these medications    amiodarone 200 MG tablet Commonly known as: Pacerone Take 1 tablet (200 mg total) by mouth 2 (two) times daily.   digoxin 0.125 MG tablet Commonly known as: LANOXIN Take 1 tablet (0.125 mg total) by mouth daily.   Eliquis 5 MG Tabs tablet Generic drug: apixaban Take 1  tablet (5 mg total) by mouth 2 (two) times daily.   Farxiga 10 MG Tabs tablet Generic drug: dapagliflozin propanediol Take 1 tablet (10 mg total) by mouth daily before breakfast.   furosemide 40 MG tablet Commonly known as: Lasix Take 1 tablet (40 mg total) by mouth daily as needed. Take as directed at discharge   polyethylene glycol 17 g packet Commonly known as: MIRALAX / GLYCOLAX Take 17 g by mouth daily as needed for mild constipation or moderate constipation.   senna 8.6 MG tablet Commonly known as: SENOKOT Take 1 tablet by mouth daily as needed for constipation.   spironolactone 25 MG tablet Commonly known as: ALDACTONE Take 0.5 tablets (12.5 mg total) by mouth daily. Start taking on: December 09, 2021        Follow-up Information     [facility] Follow up on 12/23/2021.   Specialty: Cardiology Why: at 1000. Garage Code 1404 Contact information: [address] [id number] [place]he[place] [address] [phone]              Feels good Discharge Exam: Filed Weights   12/06/21 0500 12/07/21 0450 12/08/21 0400  Weight: 93.1 kg 90.5 kg 90.1 kg   Physical Exam Vitals reviewed.  Constitutional:      General: He is not in acute distress.    Appearance: He is not ill-appearing or toxic-appearing.  Cardiovascular:     Rate and Rhythm: Normal rate and regular rhythm.     Heart sounds: No murmur heard. Pulmonary:     Effort: Pulmonary effort is normal. No respiratory distress.     Breath sounds: No wheezing, rhonchi or rales.  Neurological:     Mental Status: He is alert.  Psychiatric:        Mood and Affect: Mood normal.        Behavior: Behavior normal.   Creatinine stable 1.68  Condition at discharge: good  The results of significant diagnostics from this hospitalization (including imaging, microbiology, ancillary and laboratory) are listed below for reference.   Imaging Studies: MR CARDIAC  MORPHOLOGY W WO CONTRAST  Result Date: 12/06/2021 CLINICAL DATA:  57M presents with acute heart failure. EF 25-30% on echo. Cath showed normal coronary arteries EXAM: CARDIAC MRI TECHNIQUE: The patient was scanned on a 1.5 Tesla Siemens magnet. A dedicated cardiac coil was used. Functional imaging was done using Fiesta sequences. 2,3, and 4 chamber views were done to assess for RWMA's. Modified Simpson's rule using a short axis stack was used to calculate an ejection fraction on a dedicated work [employer]. The patient received 10 cc of Gadavist. After 10 minutes inversion recovery sequences were used to assess for infiltration and scar tissue. CONTRAST:  10 cc  of Gadavist FINDINGS: Left ventricle: -Severe dilatation -Severe systolic dysfunction -Nonspecific ECV elevation (31%) -Normal T2 values -Subepicardial LGE in mid inferior/inferolateral wall -RV insertion site LGE LV EF: 22% (Normal 56-78%) Absolute volumes: LV EDV: (Normal 77-195  mL) LV ESV: (Normal 19-72 mL) LV SV: 77mL (Normal 51-133 mL) CO: 6.3L/min (Normal 2.8-8.8 L/min) Indexed volumes: LV EDV: 138mL/sq-m (Normal 47-92 mL/sq-m) LV ESV: 14mL/sq-m (Normal 13-30 mL/sq-m) LV SV: 57mL/sq-m (Normal 32-62 mL/sq-m) CI: 2.9L/min/sq-m (Normal 1.7-4.2 L/min/sq-m) Right ventricle: Normal size with severe systolic dysfunction RV EF:  26% (Normal 47-74%) Absolute volumes: RV EDV: (Normal 88-227 mL) RV ESV: (Normal 23-103 mL) RV SV: 58mL (Normal 52-138 mL) CO: 4.7L/min (Normal 2.8-8.8 L/min) Indexed volumes: RV EDV: 167mL/sq-m (Normal 55-105 mL/sq-m) RV ESV: 43mL/sq-m (Normal 15-43 mL/sq-m) RV SV: 15mL/sq-m (Normal 32-64 mL/sq-m) CI: 2.2L/min/sq-m (Normal 1.7-4.2 L/min/sq-m) Left atrium: Severe enlargement Right atrium: Moderate enlargement Mitral valve: Moderate regurgitation (regurgitant volume 29cc, regurgitant fraction 38%) Aortic valve: No regurgitation Tricuspid valve: Mild regurgitation Pulmonic valve: No  regurgitation Aorta: Normal proximal ascending aorta Pericardium: Small effusion IMPRESSION: 1.  Severe LV dilatation with severe systolic dysfunction (EF 22%) 2.  Normal RV size with severe systolic dysfunction (EF 26%) 3. Subepicardial late gadolinium enhancement in mid inferior/inferolateral wall. This is a nonischemic scar pattern, and location of scar is often seen in myocarditis. No elevation of myocardial T2 values to suggest acute myocarditis. Suspect prior myocarditis. 4. RV insertion site LGE, which is a nonspecific scar pattern often seen in setting of elevated pulmonary pressures 5. Moderate mitral regurgitation (regurgitant volume 29cc, regurgitant fraction 38%) 6.  Small pericardial effusion Electronically Signed   By: Epifanio Lesches M.D.   On: 12/06/2021 17:32   CARDIAC CATHETERIZATION  Result Date: 12/05/2021   The left ventricular ejection fraction is less than 25% by visual estimate. Findings: Ao = 95/68 (81) LV = 91/23 RA =  8 RV = 51/10 PA = 51/23 (36) PCW = 26 (v = 45) Fick cardiac output/index = 5.7/2.6 PVR = 1.9 WU Ao sat = 93% PA sat = 67%, 67% Assessment: 1. Normal coronary arteries 2. Severe NICM EF < 25% 3. Elevated filling pressures with normal cardiac output 4. 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 Continue diuresis. Check cMRI. Arvilla Meres, MD 6:17 PM  [place] RT UPPER EXTREM LTD SOFT TISSUE NON VASCULAR  Result Date: 12/04/2021 CLINICAL DATA:  Rule out abscess. EXAM: ULTRASOUND RIGHT UPPER EXTREMITY LIMITED TECHNIQUE: Ultrasound examination of the upper extremity soft tissues was performed in the area of clinical concern. COMPARISON:  None Available. FINDINGS: Examination is limited due to overlying bandages. Scanning was performed around bandages in the right upper arm. No focal fluid collection is seen in the region of concern. A central catheter is noted in the right upper extremity 5 cm below armpit. IMPRESSION:  Limited evaluation due to overlying bandages. No focal fluid collection or abscess is seen. Electronically Signed   By: Thornell Sartorius M.D.   On: 12/04/2021 22:51   ECHO TEE  Result Date: 12/03/2021    TRANSESOPHOGEAL ECHO REPORT   Patient Name:   BURDELL POMPER Date of Exam: 12/03/2021 Medical Rec #:  [id number]      Height:       71.0 in Accession #:    [id number]     Weight:       207.4 lb Date of Birth:  Nov 02, 1960     BSA:          2.141 m Patient Age:    60 years       BP:           127/111 mmHg Patient Gender: M  HR:           161 bpm. Exam Location:  Inpatient Procedure: Transesophageal Echo, 3D Echo, Color Doppler and Cardiac Doppler Indications:     I48.91* Unspecified atrial fibrillation  History:         Patient has prior history of Echocardiogram examinations, most                  recent 11/29/2021. CHF, Arrythmias:Atrial Fibrillation; Risk                  Factors:Dyslipidemia.  Sonographer:     Irving Burton Senior RDCS Referring Phys:  [id number] Deloris Ping NAHSER Diagnosing Phys: Kristeen Miss MD PROCEDURE: After discussion of the risks and benefits of a TEE, an informed consent was obtained from the patient. The transesophogeal probe was passed without difficulty through the esophogus of the patient. Sedation performed by different physician. The patient was monitored while under deep sedation. Anesthestetic sedation was provided intravenously by Anesthesiology: 110mg  of Propofol, 100mg  of Lidocaine. The patient developed no complications during the procedure. IMPRESSIONS  1. Left ventricular ejection fraction, by estimation, is <20%. The left ventricle has severely decreased function. The left ventricle demonstrates global hypokinesis. The left ventricular internal cavity size was severely dilated.  2. Right ventricular systolic function is moderately reduced. The right ventricular size is moderately enlarged.  3. There is a large, mobile thrombus adherant to the side wall of the LAA . The tip of  the thrombus extends out to the main left atrium. . Left atrial size was severely dilated. A left atrial/left atrial appendage thrombus was detected.  4. Right atrial size was moderately dilated.  5. There is no reversal of flow in the pulmonary veins. . The mitral valve is grossly normal. Moderate mitral valve regurgitation.  6. The aortic valve is normal in structure. Aortic valve regurgitation is not visualized. No aortic stenosis is present.  7. Evidence of atrial level shunting detected by color flow Doppler. FINDINGS  Left Ventricle: Left ventricular ejection fraction, by estimation, is <20%. The left ventricle has severely decreased function. The left ventricle demonstrates global hypokinesis. The left ventricular internal cavity size was severely dilated. Right Ventricle: The right ventricular size is moderately enlarged. Right vetricular wall thickness was not well visualized. Right ventricular systolic function is moderately reduced. Left Atrium: There is a large, mobile thrombus adherant to the side wall of the LAA . The tip of the thrombus extends out to the main left atrium. Left atrial size was severely dilated. A left atrial/left atrial appendage thrombus was detected. Right Atrium: Right atrial size was moderately dilated. Pericardium: There is no evidence of pericardial effusion. Mitral Valve: There is no reversal of flow in the pulmonary veins. The mitral valve is grossly normal. Moderate mitral valve regurgitation. Tricuspid Valve: The tricuspid valve is normal in structure. Tricuspid valve regurgitation is mild. Aortic Valve: The aortic valve is normal in structure. Aortic valve regurgitation is not visualized. No aortic stenosis is present. Pulmonic Valve: The pulmonic valve was normal in structure. Pulmonic valve regurgitation is trivial. Aorta: The aortic root and ascending aorta are structurally normal, with no evidence of dilitation. IAS/Shunts: Evidence of atrial level shunting detected by  color flow Doppler. There is a small PFO by color doppler with left to right flow. Kristeen Miss MD Electronically signed by Kristeen Miss MD Signature Date/Time: 12/03/2021/5:52:13 PM    Final    [place] EKG SITE RITE  Result Date: 12/03/2021 If [facility] image not attached,  placement could not be confirmed due to current cardiac rhythm.  ECHOCARDIOGRAM COMPLETE  Result Date: 11/29/2021    ECHOCARDIOGRAM REPORT   Patient Name:   NAY KILLINGER Date of Exam: 11/29/2021 Medical Rec #:  [id number]      Height:       71.0 in Accession #:    [id number]     Weight:       232.0 lb Date of Birth:  21-Sep-1960     BSA:          2.246 m Patient Age:    60 years       BP:           100/84 mmHg Patient Gender: M              HR:           82 bpm. Exam Location:  Inpatient Procedure: 2D Echo, Cardiac Doppler and Color Doppler Indications:    Afib  History:        Patient has no prior history of Echocardiogram examinations.  Sonographer:    Milda Smart Referring Phys: [id number] TIMOTHY S OPYD IMPRESSIONS  1. Left ventricular ejection fraction, by estimation, is 25 to 30%. The left ventricle has severely decreased function. The left ventricle demonstrates global hypokinesis. The left ventricular internal cavity size was moderately dilated. Left ventricular diastolic parameters are indeterminate.  2. Right ventricular systolic function is moderately reduced. The right ventricular size is mildly enlarged. There is moderately elevated pulmonary artery systolic pressure.  3. Left atrial size was moderately dilated.  4. The mitral valve is abnormal. Mild to moderate mitral valve regurgitation. No evidence of mitral stenosis.  5. Tricuspid valve regurgitation is mild to moderate.  6. The aortic valve is tricuspid. Aortic valve regurgitation is trivial. No aortic stenosis is present.  7. The inferior vena cava is dilated in size with >50% respiratory variability, suggesting right atrial pressure of 8 mmHg. FINDINGS  Left Ventricle:  Left ventricular ejection fraction, by estimation, is 25 to 30%. The left ventricle has severely decreased function. The left ventricle demonstrates global hypokinesis. The left ventricular internal cavity size was moderately dilated. There is no left ventricular hypertrophy. Left ventricular diastolic parameters are indeterminate. Right Ventricle: The right ventricular size is mildly enlarged. No increase in right ventricular wall thickness. Right ventricular systolic function is moderately reduced. There is moderately elevated pulmonary artery systolic pressure. The tricuspid regurgitant velocity is 2.94 m/s, and with an assumed right atrial pressure of 15 mmHg, the estimated right ventricular systolic pressure is 49.6 mmHg. Left Atrium: Left atrial size was moderately dilated. Right Atrium: Right atrial size was normal in size. Pericardium: There is no evidence of pericardial effusion. Mitral Valve: The mitral valve is abnormal. There is mild thickening of the mitral valve leaflet(s). There is mild calcification of the mitral valve leaflet(s). Mild to moderate mitral valve regurgitation. No evidence of mitral valve stenosis. Tricuspid Valve: The tricuspid valve is normal in structure. Tricuspid valve regurgitation is mild to moderate. No evidence of tricuspid stenosis. Aortic Valve: The aortic valve is tricuspid. Aortic valve regurgitation is trivial. No aortic stenosis is present. Pulmonic Valve: The pulmonic valve was normal in structure. Pulmonic valve regurgitation is not visualized. No evidence of pulmonic stenosis. Aorta: The aortic root is normal in size and structure. Venous: The inferior vena cava is dilated in size with greater than 50% respiratory variability, suggesting right atrial pressure of 8 mmHg. IAS/Shunts: No atrial level shunt detected by color  flow Doppler.  LEFT VENTRICLE PLAX 2D LVIDd:         6.10 cm LVIDs:         4.90 cm LV PW:         1.10 cm LV IVS:        1.10 cm LVOT diam:     2.40  cm LVOT Area:     4.52 cm  LV Volumes (MOD) LV vol d, MOD A2C: 194.0 ml LV vol d, MOD A4C: 195.0 ml LV vol s, MOD A2C: 118.0 ml LV vol s, MOD A4C: 129.0 ml LV SV MOD A2C:     76.0 ml LV SV MOD A4C:     195.0 ml LV SV MOD BP:      75.0 ml RIGHT VENTRICLE            IVC RV S prime:     8.76 cm/s  IVC diam: 2.90 cm TAPSE (M-mode): 1.1 cm LEFT ATRIUM              Index        RIGHT ATRIUM           Index LA diam:        4.50 cm  2.00 cm/m   RA Area:     25.70 cm LA Vol (A2C):   97.7 ml  43.50 ml/m  RA Volume:   88.60 ml  39.45 ml/m LA Vol (A4C):   88.0 ml  39.18 ml/m LA Biplane Vol: 102.0 ml 45.42 ml/m   AORTA Ao Root diam: 3.20 cm Ao Asc diam:  3.30 cm MR Peak grad: 57.2 mmHg   TRICUSPID VALVE MR Mean grad: 33.0 mmHg   TR Peak grad:   34.6 mmHg MR Vmax:      378.00 cm/s TR Vmax:        294.00 cm/s MR Vmean:     263.0 cm/s                           SHUNTS                           Systemic Diam: 2.40 cm Charlton Haws MD Electronically signed by Charlton Haws MD Signature Date/Time: 11/29/2021/4:21:27 PM    Final    US Abdomen Limited RUQ (LIVER/GB)  Result Date: 11/29/2021 CLINICAL DATA:  61 year old male with abnormal LFTs. EXAM: ULTRASOUND ABDOMEN LIMITED RIGHT UPPER QUADRANT COMPARISON:  None Available. FINDINGS: Gallbladder: No gallstones or wall thickening visualized. No sonographic Murphy sign noted by sonographer. Common bile duct: Diameter: 3 mm, normal. Liver: No focal lesion identified. Within normal limits in parenchymal echogenicity. Portal vein is patent on color Doppler imaging with normal direction of blood flow towards the liver. Other: But there is a right pleural effusion visible (image 42), and evidence of trace perihepatic ascites (image 56). Negative visible right kidney. IMPRESSION: 1. Negative ultrasound appearance of the liver and gallbladder. 2. But trace perihepatic ascites, and a right pleural effusion is visible. Electronically Signed   By: Odessa Fleming M.D.   On: 11/29/2021 05:26   DG  Chest Port 1 View  Result Date: 11/28/2021 CLINICAL DATA:  Shortness of breath. EXAM: PORTABLE CHEST 1 VIEW COMPARISON:  Chest radiograph dated 04/15/2016. FINDINGS: There is slight blunting of the costophrenic angles which may represent atelectasis, or small pleural effusions. No focal consolidation or pneumothorax. Borderline cardiomegaly. No acute osseous pathology. IMPRESSION: Small bilateral  pleural effusions versus atelectasis. No focal consolidation. Electronically Signed   By: Elgie Collard M.D.   On: 11/28/2021 22:30    Microbiology: No results found for this or any previous visit.  Labs: CBC: Recent Labs  Lab 12/04/21 0430 12/05/21 0412 12/05/21 1553 12/05/21 1601 12/06/21 0518 12/07/21 0450 12/08/21 0300  WBC 7.6 8.8  --   --  8.4 7.8 8.9  HGB 14.3 14.4 16.3 16.0  15.0 13.8 13.3 14.7  HCT 43.6 43.9 48.0 47.0  44.0 42.1 41.0 43.8  MCV 89.5 88.7  --   --  90.1 88.4 88.8  PLT 153 152  --   --  127* 121* 151   Basic Metabolic Panel: Recent Labs  Lab 12/04/21 0430 12/05/21 0412 12/05/21 1553 12/05/21 1601 12/06/21 0518 12/07/21 0450 12/08/21 0300  NA 136 133* 136 137  140 133* 133* 136  K 3.7 3.9 4.3 4.2  3.7 4.1 3.7 4.4  CL 100 98  --   --  97* 96* 102  CO2 26 27  --   --  22 26 26   GLUCOSE 235* 206*  --   --  242* 233* 89  BUN 20 16  --   --  18 22* 19  CREATININE 1.58* 1.74*  --   --  1.60* 1.68* 1.68*  CALCIUM 8.3* 8.0*  --   --  8.2* 8.0* 8.6*  MG 2.1  --   --   --  2.0 2.1  --    Liver Function Tests: Recent Labs  Lab 12/03/21 1211  AST 36  ALT 46*  ALKPHOS 44  BILITOT 1.5*  PROT 6.2*  ALBUMIN 3.4*   CBG: Recent Labs  Lab 12/07/21 1125 12/07/21 1551 12/07/21 2051 12/08/21 0632 12/08/21 1123  GLUCAP 108* 96 97 91 90    Discharge time spent: less than 30 minutes.  Signed: Brendia Sacks, MD [facility] 12/08/2021

## 2021-12-09 ENCOUNTER — Other Ambulatory Visit (HOSPITAL_COMMUNITY): Payer: Self-pay

## 2021-12-19 ENCOUNTER — Encounter (HOSPITAL_COMMUNITY): Payer: Self-pay | Admitting: *Deleted

## 2021-12-19 NOTE — Progress Notes (Signed)
Disability forms completed, signed by Dr Gala Romney, and faxed to [facility]. Mychart message sent to pt

## 2021-12-23 ENCOUNTER — Ambulatory Visit (HOSPITAL_COMMUNITY)
Admit: 2021-12-23 | Discharge: 2021-12-23 | Disposition: A | Discharge status: Home / Self Care | Payer: BC Managed Care – PPO | Attending: Family Medicine | Admitting: Family Medicine

## 2021-12-23 ENCOUNTER — Encounter (HOSPITAL_COMMUNITY): Payer: Self-pay

## 2021-12-23 VITALS — BP 138/86 | HR 67 | Wt 200.2 lb

## 2021-12-23 DIAGNOSIS — I513 Intracardiac thrombosis, not elsewhere classified: Secondary | ICD-10-CM | POA: Diagnosis not present

## 2021-12-23 DIAGNOSIS — I340 Nonrheumatic mitral (valve) insufficiency: Secondary | ICD-10-CM | POA: Diagnosis not present

## 2021-12-23 DIAGNOSIS — Q2112 Patent foramen ovale: Secondary | ICD-10-CM | POA: Diagnosis not present

## 2021-12-23 DIAGNOSIS — I493 Ventricular premature depolarization: Secondary | ICD-10-CM | POA: Diagnosis not present

## 2021-12-23 DIAGNOSIS — I4891 Unspecified atrial fibrillation: Secondary | ICD-10-CM | POA: Insufficient documentation

## 2021-12-23 DIAGNOSIS — I428 Other cardiomyopathies: Secondary | ICD-10-CM | POA: Diagnosis not present

## 2021-12-23 DIAGNOSIS — Z7984 Long term (current) use of oral hypoglycemic drugs: Secondary | ICD-10-CM | POA: Insufficient documentation

## 2021-12-23 DIAGNOSIS — I5022 Chronic systolic (congestive) heart failure: Secondary | ICD-10-CM | POA: Insufficient documentation

## 2021-12-23 DIAGNOSIS — Z8679 Personal history of other diseases of the circulatory system: Secondary | ICD-10-CM | POA: Insufficient documentation

## 2021-12-23 DIAGNOSIS — N1831 Chronic kidney disease, stage 3a: Secondary | ICD-10-CM

## 2021-12-23 DIAGNOSIS — E785 Hyperlipidemia, unspecified: Secondary | ICD-10-CM | POA: Diagnosis not present

## 2021-12-23 DIAGNOSIS — Z79899 Other long term (current) drug therapy: Secondary | ICD-10-CM | POA: Diagnosis not present

## 2021-12-23 DIAGNOSIS — Z7901 Long term (current) use of anticoagulants: Secondary | ICD-10-CM | POA: Diagnosis not present

## 2021-12-23 DIAGNOSIS — I480 Paroxysmal atrial fibrillation: Secondary | ICD-10-CM | POA: Diagnosis not present

## 2021-12-23 LAB — CBC
HCT: 53.1 % — ABNORMAL HIGH (ref 39.0–52.0)
Hemoglobin: 17.4 g/dL — ABNORMAL HIGH (ref 13.0–17.0)
MCH: 29.2 pg (ref 26.0–34.0)
MCHC: 32.8 g/dL (ref 30.0–36.0)
MCV: 89.2 fL (ref 80.0–100.0)
Platelets: 226 10*3/uL (ref 150–400)
RBC: 5.95 MIL/uL — ABNORMAL HIGH (ref 4.22–5.81)
RDW: 12.5 % (ref 11.5–15.5)
WBC: 9.6 10*3/uL (ref 4.0–10.5)
nRBC: 0 % (ref 0.0–0.2)

## 2021-12-23 LAB — BASIC METABOLIC PANEL
Anion gap: 8 (ref 5–15)
BUN: 19 mg/dL (ref 6–20)
CO2: 25 mmol/L (ref 22–32)
Calcium: 9.3 mg/dL (ref 8.9–10.3)
Chloride: 106 mmol/L (ref 98–111)
Creatinine, Ser: 1.61 mg/dL — ABNORMAL HIGH (ref 0.61–1.24)
GFR, Estimated: 49 mL/min — ABNORMAL LOW (ref >60)
Glucose, Bld: 89 mg/dL (ref 70–99)
Potassium: 5.3 mmol/L — ABNORMAL HIGH (ref 3.5–5.1)
Sodium: 139 mmol/L (ref 135–145)

## 2021-12-23 MED ORDER — AMIODARONE HCL 200 MG PO TABS: 200.0000 mg | ORAL_TABLET | Freq: Two times a day (BID) | ORAL | 11 refills | Status: DC

## 2021-12-23 MED ORDER — FUROSEMIDE 40 MG PO TABS: 40.0000 mg | ORAL_TABLET | Freq: Every day | ORAL | 11 refills | Status: DC | PRN

## 2021-12-23 MED ORDER — SPIRONOLACTONE 25 MG PO TABS: 12.5000 mg | ORAL_TABLET | Freq: Every day | ORAL | 11 refills | Status: DC

## 2021-12-23 MED ORDER — ENTRESTO 24-26 MG PO TABS: 1.0000 | ORAL_TABLET | Freq: Two times a day (BID) | ORAL | 11 refills | Status: DC

## 2021-12-23 NOTE — Patient Instructions (Signed)
START Entresto 24/26 mg, one tab twice a day  Labs today We will only contact you if something comes back abnormal or we need to make some changes. Otherwise no news is good news!  Your physician recommends that you schedule a follow-up appointment in: 3 weeks with the pharmacy team, 6 weeks  in the Advanced Practitioners (PA/NP) Clinic, and in 12 weeks with Dr Gala Romney and echo  Your physician has requested that you have an echocardiogram. Echocardiography is a painless test that uses sound waves to create images of your heart. It provides your doctor with information about the size and shape of your heart and how well your heart's chambers and valves are working. This procedure takes approximately one hour. There are no restrictions for this procedure.   Do the following things EVERYDAY: Weigh yourself in the morning before breakfast. Write it down and keep it in a log. Take your medicines as prescribed Eat low salt foods--Limit salt (sodium) to 2000 mg per day.  Stay as active as you can everyday Limit all fluids for the day to less than 2 liters  At the [facility], you and your health needs are our priority. As part of our continuing mission to provide you with exceptional heart care, we have created designated Provider Care Teams. These Care Teams include your primary Cardiologist (physician) and Advanced Practice Providers (APPs- Physician Assistants and Nurse Practitioners) who all work together to provide you with the care you need, when you need it.   You may see any of the following providers on your designated Care Team at your next follow up: Dr Arvilla Meres Dr Carron Curie, NP Robbie Lis, [place] [facility] [place], [place] Karle Plumber, PharmD   Please be sure to bring in all your medications bottles to every appointment.   If you have any questions or concerns before your next appointment please send [place] a message through  [place] or call our office at [phone].    TO LEAVE A MESSAGE FOR THE NURSE SELECT OPTION 2, PLEASE LEAVE A MESSAGE INCLUDING: YOUR NAME DATE OF BIRTH CALL BACK NUMBER REASON FOR CALL**this is important as we prioritize the call backs  YOU WILL RECEIVE A CALL BACK THE SAME DAY AS LONG AS YOU CALL BEFORE 4:00 PM

## 2021-12-23 NOTE — Progress Notes (Signed)
ADVANCED HF CLINIC CONSULT NOTE   Primary Care: Pcp, No HF Cardiologist: Dr. Gala Romney  HPI: Adam Henson is a 61 y.o. male with past medical history of HLD, and new systolic HF.  Admitted 6/23 with new acute systolic heart failure.  He was found to be in atrial fibrillation with RVR rates in 160s-170s. Diuresed with IV lasix and started on Dilt gtt, later switched to metoprolol for rate control. Planned for TEE/DCCV, TEE EF < 20%, moderate MR, large LAA thrombus; no DCCV. Required amiodarone gtt and DBA for BP support. RHC/LHC showed normal coronaries, elevated filling pressures with normal cardiac output, an EF < 25%. Suspected tachy-medicated. 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 Drips weaned off, GDMT titrated. Discharged home, weight 198 lbs.  Today he returns for post hospital HF follow up. Overall feeling fine. He is not SOB with activity. Tried lifting 15 lb weigh and felt some chest tightness. Denies palpitations, abnormal bleeding,  dizziness, edema, or PND/Orthopnea. Appetite ok. No fever or chills. Weight at home 198 pounds. Taking all medications. No tobacco/ETOH/drug use.  He works at [employer]. Job requires strenuous labor. He previously exercised regularly.   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 Moderate MR  -R/LHC (6/23): EF 25%, normal cors   Ao = 95/68 (81) LV = 91/23 RA =  8 RV = 51/10  PA = 51/23 (36) PCW = 26 (v = 45) Fick cardiac output/index = 5.7/2.6 PVR = 1.9 WU Ao sat = 93% PA sat = 67%, 67%   Assessment: 1. Normal coronary arteries  2. Severe NICM EF < 25% 3. Elevated filling pressures with normal cardiac output  4. 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 Continue diuresis. Check cMRI.   - Echo (6/23): EF 25-30%, severe LV dysfunction with global  HK, RV moderately reduced, mild to moderate MR  Review of Systems: [y] = yes, [ ]  = no    General: Weight gain [ ] ; Weight loss [ ] ; Anorexia [ ] ; Fatigue [ ] ; Fever [ ] ; Chills [ ] ; Weakness [ ]   Cardiac: Chest pain/pressure [ ] ; Resting SOB [ ] ; Exertional SOB [ ] ; Orthopnea [ ] ; Pedal Edema [ ] ; Palpitations [ ] ; Syncope [ ] ; Presyncope [ ] ; Paroxysmal nocturnal dyspnea[ ]   Pulmonary: Cough [Y]; Wheezing[ ] ; Hemoptysis[ ] ; Sputum [ ] ; Snoring [ ]   GI: Vomiting[ ] ; Dysphagia[ ] ; Melena[ ] ; Hematochezia [ ] ; Heartburn[ ] ; Abdominal pain [ ] ; Constipation [ ] ; Diarrhea [ ] ; BRBPR [ ]   GU: Hematuria[ ] ; Dysuria [ ] ; Nocturia[ ]   Vascular: Pain in legs with walking [ ] ; Pain in feet with lying flat [ ] ; Non-healing sores [ ] ; Stroke [ ] ; TIA [ ] ; Slurred speech [ ] ;  Neuro: Headaches[ ] ; Vertigo[ ] ; Seizures[ ] ; Paresthesias[ ] ;Blurred vision [ ] ; Diplopia [ ] ; Vision changes [ ]   Ortho/Skin: Arthritis [ ] ; Joint pain [ ] ; Muscle pain [ ] ; Joint swelling [ ] ; Back Pain [ ] ; Rash [ ]   Psych: Depression[ ] ; Anxiety[ ]   Heme: Bleeding problems [ ] ; Clotting disorders [ ] ; Anemia [ ]   Endocrine: Diabetes [ ] ; Thyroid dysfunction[ ]   Past Medical History:  Diagnosis Date   Hyperlipidemia    Current Outpatient Medications  Medication Sig Dispense Refill   amiodarone (PACERONE) 200 MG tablet Take  1 tablet (200 mg total) by mouth 2 (two) times daily. 60 tablet 1   apixaban (ELIQUIS) 5 MG TABS tablet Take 1 tablet (5 mg total) by mouth 2 (two) times daily. 60 tablet 11   dapagliflozin propanediol (FARXIGA) 10 MG TABS tablet Take 1 tablet (10 mg total) by mouth daily before breakfast. 30 tablet 11   digoxin (LANOXIN) 0.125 MG tablet Take 1 tablet (0.125 mg total) by mouth daily. 30 tablet 11   furosemide (LASIX) 40 MG tablet Take 1 tablet (40 mg total) by mouth daily as needed. Take as directed at discharge     polyethylene glycol (MIRALAX / GLYCOLAX) 17 g packet Take 17 g by mouth daily as needed for  mild constipation or moderate constipation.     senna (SENOKOT) 8.6 MG tablet Take 1 tablet by mouth daily as needed for constipation.     spironolactone (ALDACTONE) 25 MG tablet Take 0.5 tablets (12.5 mg total) by mouth daily. 15 tablet 11   No current facility-administered medications for this encounter.   No Known Allergies  Social History   Socioeconomic History   Marital status: Single    Spouse name: Not on file   Number of children: Not on file   Years of education: Not on file   Highest education level: Not on file  Occupational History   Not on file  Tobacco Use   Smoking status: Former    Types: Cigarettes    Quit date: 09/24/1987    Years since quitting: 34.2   Smokeless tobacco: Never   Tobacco comments:    Smoked from age 8 until age 74  Substance and Sexual Activity   Alcohol use: No   Drug use: No   Sexual activity: Yes    Partners: Male  Other Topics Concern   Not on file  Social History Narrative   Not on file   Social Determinants of Health   Financial Resource Strain: Not on file  Food Insecurity: Not on file  Transportation Needs: Not on file  Physical Activity: Not on file  Stress: Not on file  Social Connections: Not on file  Intimate Partner Violence: Not on file   Family History  Problem Relation Age of Onset   Hyperlipidemia Mother    Breast cancer Mother    Cancer Mother    Hyperlipidemia Father    Lung cancer Father        deceased   Cancer Father    BP 138/86   Pulse 67   Wt 90.8 kg (200 lb 3.2 oz)   SpO2 98%   BMI 27.92 kg/m   Wt Readings from Last 3 Encounters:  12/23/21 90.8 kg (200 lb 3.2 oz)  12/08/21 90.1 kg (198 lb 9.6 oz)  09/26/13 105.2 kg (232 lb)   PHYSICAL EXAM: General:  NAD. No resp difficulty HEENT: Normal Neck: Supple. No JVD. Carotids 2+ bilat; no bruits. No lymphadenopathy or thryomegaly appreciated. Cor: PMI nondisplaced. Regular rate & rhythm. No rubs, gallops or murmurs. Lungs: Clear Abdomen:  Soft, nontender, nondistended. No hepatosplenomegaly. No bruits or masses. Good bowel sounds. Extremities: No cyanosis, clubbing, rash, edema Neuro: Alert & oriented x 3, cranial nerves grossly intact. Moves all 4 extremities w/o difficulty. Affect pleasant.  ECG (personally reviewed): NSR 65 bpm  ASSESSMENT & PLAN: 1. 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 - New diagnosis. Suspect tachy-mediated, admitted with AF with RVR.  - R/LHC (6/23): --> normal coronaries, elevated filling pressures with normal cardiac output, an EF < 25%. Suspected tachy-medicated.  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 Moderate MR - NYHA II, volume OK today. - Start Entresto 26/26 mg bid. - Continue lasix 40 mg  PRN. - Continue digoxin 0.125 mg daily. Check dig trough at next visit. - Continue spiro 12.5 mg daily. - Continue Farxiga 10 mg daily. - Labs today. - Repeat echo when GDMT optimized.   2. Atrial fibrillation with RVR - New this admit - Cardioversion aborted. Large LAA thrombus on echo. - Chemically converted with amio.  - Remains in NSR on ECG today.  - Continue amio 200 mg bid. Plan to decrease to 200 mg daily at next visit. - Continue Eliquis. No bleeding. - CBC today.   3. CKD 3a - Baseline uncertain, Scr 1.5 in 2015 - Labs today.   4. PVCs, rare - None on ECG today. - Labs today.  Follow up in 3 weeks with PharmD (start Coreg and check digoxin trough), 6 weeks with APP and 12 weeks with Dr. Gala Romney + echo.  Prince Rome, FNP-BC 12/23/21

## 2021-12-24 ENCOUNTER — Other Ambulatory Visit (HOSPITAL_COMMUNITY): Payer: Self-pay

## 2021-12-24 ENCOUNTER — Telehealth (HOSPITAL_COMMUNITY): Payer: Self-pay

## 2021-12-24 ENCOUNTER — Telehealth (HOSPITAL_COMMUNITY): Payer: Self-pay | Admitting: Pharmacist

## 2021-12-24 DIAGNOSIS — I5022 Chronic systolic (congestive) heart failure: Secondary | ICD-10-CM

## 2021-12-24 MED ORDER — DAPAGLIFLOZIN PROPANEDIOL 10 MG PO TABS: 10.0000 mg | ORAL_TABLET | Freq: Every day | ORAL | 11 refills | Status: DC

## 2021-12-24 MED ORDER — DIGOXIN 125 MCG PO TABS: 0.1250 mg | ORAL_TABLET | Freq: Every day | ORAL | 11 refills | Status: DC

## 2021-12-24 MED ORDER — APIXABAN 5 MG PO TABS: 5.0000 mg | ORAL_TABLET | Freq: Two times a day (BID) | ORAL | 11 refills | Status: DC

## 2021-12-24 NOTE — Telephone Encounter (Signed)
Pharmacy Transitions of Care Follow-up Telephone Call  Date of discharge: 12/06/21  Discharge Diagnosis: Atrial Fibrillation   How have you been since you were released from the hospital? Patient has been doing well since discharge. He does not have any questions or concerns regarding his medications.    Medication changes made at discharge: STOP taking: BLACK CURRANT SEED OIL PO  ELDERBERRY PO   Medication changes verified by the patient? Yes     Medication Accessibility:  [facility]   Was the patient provided with refills on discharged medications? Yes   Have all prescriptions been transferred from [facility] to home pharmacy? Yes   Is the patient able to afford medications? Has insurance, BCBS  Medication Review:  APIXABAN (ELIQUIS)  apixaban 5 mg BID started on 12/05/21 - Discussed importance of taking medication around the same time everyday  - Advised patient of medications to avoid (NSAIDs, ASA)  - Educated that Tylenol (acetaminophen) will be the preferred analgesic to prevent risk of bleeding  - Emphasized importance of monitoring for signs and symptoms of bleeding (abnormal bruising, prolonged bleeding, nose bleeds, bleeding from gums, discolored urine, black tarry stools)  - Advised patient to alert all providers of anticoagulation therapy prior to starting a new medication or having a procedure   Follow-up Appointments:  [facility] f/u appt confirmed? Saw Prince Rome, FNP on 12/23/2021.   If their condition worsens, is the pt aware to call PCP or go to the Emergency Dept.? Yes  Final Patient Assessment: Patient has refills at home pharmacy and has already had follow-up.

## 2021-12-24 NOTE — Telephone Encounter (Signed)
Received call from [facility] that [facility] not [facility] medication until [facility]/02/22 and he was concerned. Called patient and he stated that [facility] not [facility] his spironolactone until 01/02/22. It appears it is a "refill too soon" because patient was taking spironolactone 12.5 BID instead of 12.5 mg daily.   Upon further review, result note from [facility] FNP yesterday stated: K is too high. Stop spiro and do NOT start Entresto. Repeat  BMET and needs a digoxin trough in 1 week. Patient had not received this message yet (VM was left for him to call back). Spoke with Shanda Bumps. Since he was taking spironolactone incorrectly, will instead have him continue to hold spironolactone, but he can start Entresto. Repeat BMET and digoxin level next week. Patient expressed understanding. Medication list updated and orders placed.   Karle Plumber, PharmD, BCPS, BCCP, CPP Heart Failure Clinic Pharmacist [phone]

## 2021-12-30 NOTE — Progress Notes (Signed)
Advanced Heart Failure Clinic Note   Primary Care: Pcp, No HF Cardiologist: Dr. Gala Romney  HPI:  Adam Henson is a 61 y.o. male with past medical history of HLD, and new systolic HF.   Admitted 11/2021 with new acute systolic heart failure.  He was found to be in atrial fibrillation with RVR rates in 160s-170s. Diuresed with IV Lasix and started on Dilt gtt, later switched to metoprolol for rate control. Planned for TEE/DCCV, TEE EF < 20%, moderate MR, large LAA thrombus; no DCCV. Required amiodarone gtt and DBA for BP support. RHC/LHC showed normal coronaries, elevated filling pressures with normal cardiac output, and EF < 25%. Suspected tachy-medicated. 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 Drips weaned off, GDMT titrated. Discharged home, weight 198 lbs.   Returned to [facility] for post hospital HF follow up. Overall was feeling fine. He was not SOB with activity. Tried lifting 15 lb weight and felt some chest tightness. Denied palpitations, abnormal bleeding,  dizziness, edema, or PND/Orthopnea. Appetite was ok. No fever or chills. Weight at home was 198 pounds. Reported taking all medications. No tobacco/ETOH/drug use.  He works at [employer]. Job requires strenuous labor. He previously exercised regularly.   Today he returns to HF clinic for pharmacist medication titration. At last visit with APP, Entresto 24/26 mg BID was initiated. Additionally, spironolactone was held after BMET resulted with K of 5.3. Of note, he was accidentally taking spironolactone 25 mg daily instead of 12.5 mg daily as prescribed. Overall he is feeling well today. No dizziness, lightheadedness, CP or palpitations. Has been staying active and trying to gain weight. Walks 4 miles every day. Has not needed any PRN Lasix. No LEE, PND or orthopnea. Of note, he stopped taking digoxin because he thought he was also supposed to stop that when starting the  [facility]. He did appropriately stop the spironolactone.     HF Medications: Entresto 24/26 mg BID Farxiga 10 mg daily Digoxin 0.125 mg daily Lasix 40 mg PRN  Has the patient been experiencing any side effects to the medications prescribed?  no  Does the patient have any problems obtaining medications due to transportation or finances?   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 Has Entresto copay card. 90 day supply for [facility]).   Understanding of regimen: fair Understanding of indications: fair Potential of compliance: good Patient understands to avoid NSAIDs. Patient understands to avoid decongestants.    Pertinent Lab Values: 01/02/22: Serum creatinine 1.65, BUN 20, Potassium 5.1, Sodium 139  Vital Signs: Weight: 207 lbs (last clinic weight: 200.2 lbs) Blood pressure: 140/76  Heart rate: 61   Assessment/Plan: 1. Chronic Systolic Heart Failure: - Echo (11/2021): EF 25-30%, RV moderately reduced, mild to moderate MR, mild to moderate TR - TEE (11/2021): EF < 20%, moderate MR, large LAA thrombus, + PFO with left to right shunt. - New diagnosis. Suspect tachy-mediated, admitted with AF with RVR.  - R/LHC (11/2021): --> normal coronaries, elevated filling pressures with normal cardiac output, an EF < 25%. Suspected tachy-medicated.  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 Moderate MR - NYHA II, euvolemic on exam - Continue Lasix 40 mg PRN. - Increase Entresto to 49/51 mg BID. BMET in 2 weeks. - Continue Farxiga 10 mg daily. - Restart digoxin 0.125 mg daily.  - Spironolactone on hold for recent hyperkalemia. Discussed  following a low potassium diet today. He eats a lot of bananas.  - Repeat echo when GDMT optimized.   2. Atrial fibrillation with RVR - New this admit - Cardioversion aborted. Large LAA thrombus on echo. - Chemically converted with amiodarone.  -Decrease amiodarone to 200 mg  daily - Continue Eliquis. No bleeding.   3. CKD 3a - Baseline uncertain, Scr 1.5 in 2015   4. PVCs, rare - None on ECG 12/23/21.  Follow up 3 weeks with APP Clinic    Karle Plumber, PharmD, BCPS, BCCP, CPP Heart Failure Clinic Pharmacist [phone]

## 2022-01-01 ENCOUNTER — Telehealth: Payer: Self-pay | Admitting: Cardiology

## 2022-01-01 NOTE — Telephone Encounter (Signed)
Pt called to know what medication to hold for labs tomorrow.  I explained digoxin. But he said he was told to stop so no longer taking.  He did hold spironolactone and has continued entresto.  I asked him to discuss tomorrow with appt.

## 2022-01-02 ENCOUNTER — Ambulatory Visit (HOSPITAL_COMMUNITY)
Admission: RE | Admit: 2022-01-02 | Discharge: 2022-01-02 | Disposition: A | Discharge status: Home / Self Care | Payer: BC Managed Care – PPO | Source: Ambulatory Visit | Attending: Internal Medicine | Admitting: Internal Medicine

## 2022-01-02 DIAGNOSIS — I5022 Chronic systolic (congestive) heart failure: Secondary | ICD-10-CM | POA: Diagnosis present

## 2022-01-02 LAB — BASIC METABOLIC PANEL
Anion gap: 6 (ref 5–15)
BUN: 20 mg/dL (ref 6–20)
CO2: 28 mmol/L (ref 22–32)
Calcium: 9.3 mg/dL (ref 8.9–10.3)
Chloride: 105 mmol/L (ref 98–111)
Creatinine, Ser: 1.65 mg/dL — ABNORMAL HIGH (ref 0.61–1.24)
GFR, Estimated: 47 mL/min — ABNORMAL LOW (ref >60)
Glucose, Bld: 90 mg/dL (ref 70–99)
Potassium: 5.1 mmol/L (ref 3.5–5.1)
Sodium: 139 mmol/L (ref 135–145)

## 2022-01-02 LAB — DIGOXIN LEVEL: Digoxin Level: <0.2 ng/mL — ABNORMAL LOW (ref 0.8–2.0)

## 2022-01-13 ENCOUNTER — Ambulatory Visit (HOSPITAL_COMMUNITY)
Admission: RE | Admit: 2022-01-13 | Discharge: 2022-01-13 | Disposition: A | Discharge status: Home / Self Care | Payer: BC Managed Care – PPO | Source: Ambulatory Visit | Attending: Cardiology | Admitting: Cardiology

## 2022-01-13 VITALS — BP 140/76 | HR 61 | Resp 96 | Wt 207.0 lb

## 2022-01-13 DIAGNOSIS — I252 Old myocardial infarction: Secondary | ICD-10-CM | POA: Diagnosis not present

## 2022-01-13 DIAGNOSIS — N1831 Chronic kidney disease, stage 3a: Secondary | ICD-10-CM | POA: Insufficient documentation

## 2022-01-13 DIAGNOSIS — I4891 Unspecified atrial fibrillation: Secondary | ICD-10-CM | POA: Insufficient documentation

## 2022-01-13 DIAGNOSIS — I5022 Chronic systolic (congestive) heart failure: Secondary | ICD-10-CM

## 2022-01-13 DIAGNOSIS — E785 Hyperlipidemia, unspecified: Secondary | ICD-10-CM | POA: Insufficient documentation

## 2022-01-13 MED ORDER — APIXABAN 5 MG PO TABS: 5.0000 mg | ORAL_TABLET | Freq: Two times a day (BID) | ORAL | 3 refills | Status: DC

## 2022-01-13 MED ORDER — FUROSEMIDE 40 MG PO TABS: 40.0000 mg | ORAL_TABLET | Freq: Every day | ORAL | 3 refills | Status: DC | PRN

## 2022-01-13 MED ORDER — SACUBITRIL-VALSARTAN 49-51 MG PO TABS: 1.0000 | ORAL_TABLET | Freq: Two times a day (BID) | ORAL | 3 refills | Status: DC

## 2022-01-13 MED ORDER — DAPAGLIFLOZIN PROPANEDIOL 10 MG PO TABS: 10.0000 mg | ORAL_TABLET | Freq: Every day | ORAL | 3 refills | Status: DC

## 2022-01-13 MED ORDER — DIGOXIN 125 MCG PO TABS: 0.1250 mg | ORAL_TABLET | Freq: Every day | ORAL | 3 refills | Status: DC

## 2022-01-13 MED ORDER — AMIODARONE HCL 200 MG PO TABS: 200.0000 mg | ORAL_TABLET | Freq: Every day | ORAL | 3 refills | Status: DC

## 2022-01-13 NOTE — Patient Instructions (Signed)
It was a pleasure seeing you today!  MEDICATIONS: -We are changing your medications today -Restart digoxin 0.125 mg (1 tablet) daily -Decrease amiodarone to 200 mg (1 tablet) daily -Increase Entresto to 49/51 mg (1 tablet) twice daily. You may take 2 tablets of the 24/26 mg strength twice daily until you receive the new strength.  -Call if you have questions about your medications.   NEXT APPOINTMENT: Return to clinic in 3 weeks with APP Clinic.  In general, to take care of your heart failure: -Limit your fluid intake to 2 Liters (half-gallon) per day.   -Limit your salt intake to ideally 2-3 grams (2000-3000 mg) per day. -Weigh yourself daily and record, and bring that "weight diary" to your next appointment.  (Weight gain of 2-3 pounds in 1 day typically means fluid weight.) -The medications for your heart are to help your heart and help you live longer.   -Please contact us before stopping any of your heart medications.  Call the clinic at [phone] with questions or to reschedule future appointments.

## 2022-01-16 ENCOUNTER — Other Ambulatory Visit (HOSPITAL_COMMUNITY): Payer: Self-pay

## 2022-01-16 NOTE — Telephone Encounter (Signed)
error 

## 2022-01-17 ENCOUNTER — Telehealth (HOSPITAL_COMMUNITY): Payer: Self-pay | Admitting: Pharmacy Technician

## 2022-01-17 ENCOUNTER — Other Ambulatory Visit (HOSPITAL_COMMUNITY): Payer: Self-pay

## 2022-01-17 NOTE — Telephone Encounter (Signed)
Patient Advocate Encounter   Received notification from [employer] that prior authorization for Sherryll Burger is required.   PA submitted on CoverMyMeds Key [id number] Status is pending   Will continue to follow.

## 2022-01-20 ENCOUNTER — Other Ambulatory Visit (HOSPITAL_COMMUNITY): Payer: Self-pay

## 2022-01-20 NOTE — Telephone Encounter (Signed)
Advanced Heart Failure Patient Advocate Encounter  Prior Authorization for Sherryll Burger has been approved.    PA#  [id number] Effective dates: 01/18/22 through 01/18/25  Archer Asa, CPhT

## 2022-02-03 ENCOUNTER — Telehealth (HOSPITAL_COMMUNITY): Payer: Self-pay

## 2022-02-03 ENCOUNTER — Encounter (HOSPITAL_COMMUNITY): Payer: Self-pay

## 2022-02-03 ENCOUNTER — Ambulatory Visit (HOSPITAL_COMMUNITY)
Admission: RE | Admit: 2022-02-03 | Discharge: 2022-02-03 | Disposition: A | Discharge status: Home / Self Care | Payer: BC Managed Care – PPO | Source: Ambulatory Visit | Attending: Family Medicine | Admitting: Family Medicine

## 2022-02-03 VITALS — BP 122/82 | HR 60 | Ht 71.0 in | Wt 206.0 lb

## 2022-02-03 DIAGNOSIS — Q2112 Patent foramen ovale: Secondary | ICD-10-CM | POA: Insufficient documentation

## 2022-02-03 DIAGNOSIS — N1831 Chronic kidney disease, stage 3a: Secondary | ICD-10-CM | POA: Insufficient documentation

## 2022-02-03 DIAGNOSIS — I5022 Chronic systolic (congestive) heart failure: Secondary | ICD-10-CM

## 2022-02-03 DIAGNOSIS — Z8679 Personal history of other diseases of the circulatory system: Secondary | ICD-10-CM | POA: Insufficient documentation

## 2022-02-03 DIAGNOSIS — Z7901 Long term (current) use of anticoagulants: Secondary | ICD-10-CM | POA: Insufficient documentation

## 2022-02-03 DIAGNOSIS — Z79899 Other long term (current) drug therapy: Secondary | ICD-10-CM | POA: Insufficient documentation

## 2022-02-03 DIAGNOSIS — I4891 Unspecified atrial fibrillation: Secondary | ICD-10-CM | POA: Insufficient documentation

## 2022-02-03 DIAGNOSIS — E785 Hyperlipidemia, unspecified: Secondary | ICD-10-CM | POA: Diagnosis present

## 2022-02-03 DIAGNOSIS — Z7984 Long term (current) use of oral hypoglycemic drugs: Secondary | ICD-10-CM | POA: Diagnosis not present

## 2022-02-03 DIAGNOSIS — I480 Paroxysmal atrial fibrillation: Secondary | ICD-10-CM

## 2022-02-03 DIAGNOSIS — I493 Ventricular premature depolarization: Secondary | ICD-10-CM | POA: Diagnosis not present

## 2022-02-03 LAB — BASIC METABOLIC PANEL
Anion gap: 7 (ref 5–15)
BUN: 15 mg/dL (ref 6–20)
CO2: 25 mmol/L (ref 22–32)
Calcium: 9 mg/dL (ref 8.9–10.3)
Chloride: 103 mmol/L (ref 98–111)
Creatinine, Ser: 1.65 mg/dL — ABNORMAL HIGH (ref 0.61–1.24)
GFR, Estimated: 47 mL/min — ABNORMAL LOW (ref >60)
Glucose, Bld: 88 mg/dL (ref 70–99)
Potassium: 4.4 mmol/L (ref 3.5–5.1)
Sodium: 135 mmol/L (ref 135–145)

## 2022-02-03 MED ORDER — CARVEDILOL 3.125 MG PO TABS: 3.1250 mg | ORAL_TABLET | Freq: Two times a day (BID) | ORAL | 1 refills | Status: DC

## 2022-02-03 NOTE — Telephone Encounter (Signed)
Pt is not interested in cardiac rehab right now, pt stated that he will call back, if anything changes. Closed referral.

## 2022-02-03 NOTE — Patient Instructions (Signed)
Thank you for coming in today  Labs were done today, if any labs are abnormal the clinic will call you No news is good news  DECREASE Amiodarone to 200 mg daily  START Carvedilol 3.125 mg 1 tablet twice daily     Do the following things EVERYDAY: Weigh yourself in the morning before breakfast. Write it down and keep it in a log. Take your medicines as prescribed Eat low salt foods--Limit salt (sodium) to 2000 mg per day.  Stay as active as you can everyday Limit all fluids for the day to less than 2 liters  At the Advanced Heart Failure Clinic, you and your health needs are our priority. As part of our continuing mission to provide you with exceptional heart care, we have created designated Provider Care Teams. These Care Teams include your primary Cardiologist (physician) and Advanced Practice Providers (APPs- Physician Assistants and Nurse Practitioners) who all work together to provide you with the care you need, when you need it.   You may see any of the following providers on your designated Care Team at your next follow up: Dr Arvilla Meres Dr Carron Curie, NP Robbie Lis, [place] [facility] [place], [place] Karle Plumber, PharmD   Please be sure to bring in all your medications bottles to every appointment.   If you have any questions or concerns before your next appointment please send [place] a message through [place] or call our office at [phone].    TO LEAVE A MESSAGE FOR THE NURSE SELECT OPTION 2, PLEASE LEAVE A MESSAGE INCLUDING: YOUR NAME DATE OF BIRTH CALL BACK NUMBER REASON FOR CALL**this is important as we prioritize the call backs  YOU WILL RECEIVE A CALL BACK THE SAME DAY AS LONG AS YOU CALL BEFORE 4:00 PM

## 2022-02-03 NOTE — Progress Notes (Signed)
ADVANCED HF CLINIC NOTE   Primary Care: Pcp, No HF Cardiologist: Dr. Gala Romney  HPI: Adam Henson is a 61 y.o. male with past medical history of HLD, and new systolic HF.  Admitted 6/23 with new acute systolic heart failure.  He was found to be in atrial fibrillation with RVR rates in 160s-170s. Diuresed with IV lasix and started on Dilt gtt, later switched to metoprolol for rate control. Planned for TEE/DCCV, TEE EF < 20%, moderate MR, large LAA thrombus; no DCCV. Required amiodarone gtt and DBA for BP support. RHC/LHC showed normal coronaries, elevated filling pressures with normal cardiac output, an EF < 25%. Suspected tachy-medicated. 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 Drips weaned off, GDMT titrated. Discharged home, weight 198 lbs.  Today he returns for HF follow up. Overall feeling great. Walks daily for 1.5 hrs (about 4 miles) and lifts 20 lb weights at home gym. He is not SOB with activity. Denies palpitations, CP, dizziness, edema, or PND/Orthopnea. Appetite ok. No fever or chills. Weight at home 204 pounds. Taking all medications.  No tobacco/ETOH/drug use.  He works at [employer]. 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 Moderate MR  - R/LHC (6/23): EF 25%, normal cors   Ao = 95/68 (81) LV = 91/23 RA =  8 RV = 51/10  PA = 51/23 (36) PCW = 26 (v = 45) Fick cardiac output/index = 5.7/2.6 PVR = 1.9 WU Ao sat = 93% PA sat = 67%, 67%   Assessment: 1. Normal coronary arteries  2. Severe NICM EF < 25% 3. Elevated filling pressures with normal cardiac output  4. 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 Continue diuresis. Check cMRI.   - Echo (6/23): EF 25-30%, severe LV dysfunction with global HK, RV moderately reduced, mild  to moderate MR  Past Medical History:  Diagnosis Date   Hyperlipidemia    Current Outpatient Medications  Medication Sig Dispense Refill   amiodarone (PACERONE) 200 MG tablet Take 1 tablet (200 mg total) by mouth daily. 90 tablet 3   apixaban (ELIQUIS) 5 MG TABS tablet Take 1 tablet (5 mg total) by mouth 2 (two) times daily. 180 tablet 3   dapagliflozin propanediol (FARXIGA) 10 MG TABS tablet Take 1 tablet (10 mg total) by mouth daily before breakfast. 90 tablet 3   digoxin (LANOXIN) 0.125 MG tablet Take 1 tablet (0.125 mg total) by mouth daily. 90 tablet 3   furosemide (LASIX) 40 MG tablet Take 1 tablet (40 mg total) by mouth daily as needed. 90 tablet 3   sacubitril-valsartan (ENTRESTO) 49-51 MG Take 1 tablet by mouth 2 (two) times daily. 180 tablet 3   No current facility-administered medications for this encounter.   No Known Allergies  Social History   Socioeconomic History   Marital status: Single    Spouse name: Not on file   Number of children: Not on file   Years of education: Not on file   Highest education level: Not on file  Occupational History   Not on file  Tobacco Use   Smoking status: Former    Types: Cigarettes    Quit date: 09/24/1987    Years since quitting: 34.3   Smokeless tobacco: Never   Tobacco comments:    Smoked  from age 4 until age 35  Substance and Sexual Activity   Alcohol use: No   Drug use: No   Sexual activity: Yes    Partners: Male  Other Topics Concern   Not on file  Social History Narrative   Not on file   Social Determinants of Health   Financial Resource Strain: Not on file  Food Insecurity: Not on file  Transportation Needs: Not on file  Physical Activity: Not on file  Stress: Not on file  Social Connections: Not on file  Intimate Partner Violence: Not on file   Family History  Problem Relation Age of Onset   Hyperlipidemia Mother    Breast cancer Mother    Cancer Mother    Hyperlipidemia Father    Lung cancer  Father        deceased   Cancer Father    BP 122/82   Pulse 60   Ht 5\' 11"  (1.803 m)   Wt 93.4 kg (206 lb)   BMI 28.73 kg/m   Wt Readings from Last 3 Encounters:  02/03/22 93.4 kg (206 lb)  01/13/22 93.9 kg (207 lb)  12/23/21 90.8 kg (200 lb 3.2 oz)   PHYSICAL EXAM: General:  NAD. No resp difficulty HEENT: Normal Neck: Supple. No JVD. Carotids 2+ bilat; no bruits. No lymphadenopathy or thryomegaly appreciated. Cor: PMI nondisplaced. Regular rate & rhythm. No rubs, gallops or murmurs. Lungs: Clear Abdomen: Soft, nontender, nondistended. No hepatosplenomegaly. No bruits or masses. Good bowel sounds. Extremities: No cyanosis, clubbing, rash, edema Neuro: Alert & oriented x 3, cranial nerves grossly intact. Moves all 4 extremities w/o difficulty. Affect pleasant.  ECG (personally reviewed): NSR 61 bpm  ASSESSMENT & PLAN: 1. 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 - New diagnosis. Suspect tachy-mediated, admitted with AF with RVR.  - R/LHC (6/23): --> normal coronaries, elevated filling pressures with normal cardiac output, an EF < 25%. Suspected tachy-medicated.  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 Moderate MR - NYHA I, volume looks good today. - Start carvedilol 3.125 mg bid. - Continue Entresto 49/51 mg bid. - Continue digoxin 0.125 mg daily. Dig level 0.2, 01/02/22. - Continue Farxiga 10 mg daily. - Continue Lasix 40 mg PRN. Has not needed any. - Off spiro with elevated K. - Repeat echo next visit. - BMET today.   2. Atrial fibrillation with RVR - New this admit - Cardioversion aborted. Large LAA thrombus on echo. - Chemically converted with amio.  - Remains in NSR on ECG today.  - Decrease amio to 200 mg daily. - Continue Eliquis. No  bleeding.   3. CKD 3a - Baseline Scr 1.6. - Labs today.   4. PVCs, rare - None on ECG today. - Labs today.  Follow up in 6 weeks with Dr. 01/04/22 + echo, as scheduled.  Gala Romney, FNP-BC 02/03/22

## 2022-03-10 ENCOUNTER — Encounter (HOSPITAL_COMMUNITY): Payer: Self-pay | Admitting: *Deleted

## 2022-03-10 NOTE — Progress Notes (Signed)
Disability forms completed, signed by Dr Haroldine Laws, and faxed along with requested records to [place] (signed ROI included with forms)  Pt aware, copy faxed to him at [phone]

## 2022-03-26 ENCOUNTER — Ambulatory Visit (HOSPITAL_BASED_OUTPATIENT_CLINIC_OR_DEPARTMENT_OTHER)
Admission: RE | Admit: 2022-03-26 | Discharge: 2022-03-26 | Disposition: A | Discharge status: Home / Self Care | Payer: BC Managed Care – PPO | Source: Ambulatory Visit | Attending: Internal Medicine | Admitting: Internal Medicine

## 2022-03-26 ENCOUNTER — Ambulatory Visit (HOSPITAL_COMMUNITY)
Admission: RE | Admit: 2022-03-26 | Discharge: 2022-03-26 | Disposition: A | Discharge status: Home / Self Care | Payer: BC Managed Care – PPO | Source: Ambulatory Visit | Attending: Internal Medicine | Admitting: Internal Medicine

## 2022-03-26 VITALS — BP 156/84 | HR 53 | Wt 214.0 lb

## 2022-03-26 DIAGNOSIS — I5022 Chronic systolic (congestive) heart failure: Secondary | ICD-10-CM | POA: Diagnosis not present

## 2022-03-26 DIAGNOSIS — I10 Essential (primary) hypertension: Secondary | ICD-10-CM | POA: Diagnosis not present

## 2022-03-26 DIAGNOSIS — N1831 Chronic kidney disease, stage 3a: Secondary | ICD-10-CM | POA: Diagnosis not present

## 2022-03-26 DIAGNOSIS — I480 Paroxysmal atrial fibrillation: Secondary | ICD-10-CM | POA: Insufficient documentation

## 2022-03-26 DIAGNOSIS — I493 Ventricular premature depolarization: Secondary | ICD-10-CM | POA: Diagnosis not present

## 2022-03-26 DIAGNOSIS — Q2112 Patent foramen ovale: Secondary | ICD-10-CM | POA: Insufficient documentation

## 2022-03-26 DIAGNOSIS — I4891 Unspecified atrial fibrillation: Secondary | ICD-10-CM

## 2022-03-26 DIAGNOSIS — I5042 Chronic combined systolic (congestive) and diastolic (congestive) heart failure: Secondary | ICD-10-CM | POA: Diagnosis not present

## 2022-03-26 DIAGNOSIS — G4719 Other hypersomnia: Secondary | ICD-10-CM

## 2022-03-26 DIAGNOSIS — R0683 Snoring: Secondary | ICD-10-CM | POA: Diagnosis not present

## 2022-03-26 DIAGNOSIS — Z7984 Long term (current) use of oral hypoglycemic drugs: Secondary | ICD-10-CM | POA: Diagnosis not present

## 2022-03-26 DIAGNOSIS — Z8679 Personal history of other diseases of the circulatory system: Secondary | ICD-10-CM | POA: Insufficient documentation

## 2022-03-26 DIAGNOSIS — Z79899 Other long term (current) drug therapy: Secondary | ICD-10-CM | POA: Diagnosis not present

## 2022-03-26 DIAGNOSIS — I130 Hypertensive heart and chronic kidney disease with heart failure and stage 1 through stage 4 chronic kidney disease, or unspecified chronic kidney disease: Secondary | ICD-10-CM | POA: Diagnosis present

## 2022-03-26 DIAGNOSIS — Z7901 Long term (current) use of anticoagulants: Secondary | ICD-10-CM | POA: Diagnosis not present

## 2022-03-26 LAB — COMPREHENSIVE METABOLIC PANEL
ALT: 21 U/L (ref 0–44)
AST: 21 U/L (ref 15–41)
Albumin: 4.1 g/dL (ref 3.5–5.0)
Alkaline Phosphatase: 48 U/L (ref 38–126)
Anion gap: 4 — ABNORMAL LOW (ref 5–15)
BUN: 16 mg/dL (ref 6–20)
CO2: 27 mmol/L (ref 22–32)
Calcium: 8.7 mg/dL — ABNORMAL LOW (ref 8.9–10.3)
Chloride: 106 mmol/L (ref 98–111)
Creatinine, Ser: 1.53 mg/dL — ABNORMAL HIGH (ref 0.61–1.24)
GFR, Estimated: 52 mL/min — ABNORMAL LOW (ref >60)
Glucose, Bld: 89 mg/dL (ref 70–99)
Potassium: 4.3 mmol/L (ref 3.5–5.1)
Sodium: 137 mmol/L (ref 135–145)
Total Bilirubin: 1.3 mg/dL — ABNORMAL HIGH (ref 0.3–1.2)
Total Protein: 7.4 g/dL (ref 6.5–8.1)

## 2022-03-26 LAB — ECHOCARDIOGRAM COMPLETE
Area-P 1/2: 3.13 cm2
Calc EF: 55.7 %
S' Lateral: 4.1 cm
Single Plane A2C EF: 57 %
Single Plane A4C EF: 55.6 %

## 2022-03-26 LAB — BRAIN NATRIURETIC PEPTIDE: B Natriuretic Peptide: 29.3 pg/mL (ref 0.0–100.0)

## 2022-03-26 MED ORDER — ENTRESTO 97-103 MG PO TABS: 1.0000 | ORAL_TABLET | Freq: Two times a day (BID) | ORAL | 6 refills | Status: DC

## 2022-03-26 NOTE — Progress Notes (Signed)
  Echocardiogram 2D Echocardiogram has been performed.  Adam Henson 03/26/2022, 2:47 PM

## 2022-03-26 NOTE — Patient Instructions (Signed)
Medication Changes:  STOP Digoxin  Increase Entresto to 97/103 mg Twice daily   Lab Work:  Labs done today, your results will be available in MyChart, we will contact you for abnormal readings.  Testing/Procedures:  Your provider has recommended that you have a home sleep study.  We have provided you with the equipment in our office today. Please download the app and follow the instructions. YOUR PIN NUMBER IS: [id number]. Once you have completed the test you just dispose of the equipment, the information is automatically uploaded to [place] via blue-tooth technology. If your test is positive for sleep apnea and you need a home CPAP machine you will be contacted by Dr Theodosia Blender office [facility]) to set this up.  Referrals:  You have been referred to Dr Quentin Ore for possible afib ablation   Special Instructions // Education:  Do the following things EVERYDAY: Weigh yourself in the morning before breakfast. Write it down and keep it in a log. Take your medicines as prescribed Eat low salt foods--Limit salt (sodium) to 2000 mg per day.  Stay as active as you can everyday Limit all fluids for the day to less than 2 liters  We have provided you with your Return To Work Release Form releasing you to return to work on 04/02/22 with no restrictions   Follow-Up in: 4 months (February 2024), **PLEASE CALL OUR OFFICE IN DECEMBER TO SCHEDULE THIS APPOINTMENT   At the [facility], you and your health needs are our priority. We have a designated team specialized in the treatment of Heart Failure. This Care Team includes your primary Heart Failure Specialized Cardiologist (physician), Advanced Practice Providers (APPs- Physician Assistants and Nurse Practitioners), and Pharmacist who all work together to provide you with the care you need, when you need it.   You may see any of the following providers on your designated Care Team at your next follow up:  Dr. Glori Bickers Dr.  Loralie Champagne Dr. Roxana Hires, NP Lyda Jester, [place] [facility] [place], [place] Forestine Na, NP Audry Riles, PharmD   Please be sure to bring in all your medications bottles to every appointment.   Need to Contact us:  If you have any questions or concerns before your next appointment please send [place] a message through [place] or call our office at [phone].    TO LEAVE A MESSAGE FOR THE NURSE SELECT OPTION 2, PLEASE LEAVE A MESSAGE INCLUDING: YOUR NAME DATE OF BIRTH CALL BACK NUMBER REASON FOR CALL**this is important as we prioritize the call backs  YOU WILL RECEIVE A CALL BACK THE SAME DAY AS LONG AS YOU CALL BEFORE 4:00 PM

## 2022-03-26 NOTE — Progress Notes (Signed)
Height: 5'11"    Weight: 214 lb BMI: 29.85  Today's Date: 03/26/22  STOP BANG RISK ASSESSMENT S (snore) Have you been told that you snore?     YES   T (tired) Are you often tired, fatigued, or sleepy during the day?   YES  O (obstruction) Do you stop breathing, choke, or gasp during sleep? NO   P (pressure) Do you have or are you being treated for high blood pressure? YES   B (BMI) Is your body index greater than 35 kg/m? NO   A (age) Are you 61 years old or older? YES   N (neck) Do you have a neck circumference greater than 16 inches?      G (gender) Are you a male? YES   TOTAL STOP/BANG "YES" ANSWERS 5                                                                       For Office Use Only              Procedure Order Form    YES to 3+ Stop Bang questions OR two clinical symptoms - patient qualifies for WatchPAT (CPT 95800)      Clinical Notes: Will consult Sleep Specialist and refer for management of therapy due to patient increased risk of Sleep Apnea. Ordering a sleep study due to the following two clinical symptoms: Excessive daytime sleepiness G47.10 / Loud snoring R06.83 /  History of high blood pressure R03.0

## 2022-03-26 NOTE — Progress Notes (Signed)
ADVANCED HF CLINIC NOTE   Primary Care: Pcp, No HF Cardiologist: Dr. Haroldine Laws  HPI: Adam Henson is a 61 y.o. 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  He was found to be in atrial fibrillation with RVR rates in 160s-170s. Diuresed with IV lasix and started on Dilt gtt, later switched to metoprolol for rate control. Planned for TEE/DCCV, TEE EF < 20%, moderate MR, large LAA thrombus; no DCCV. Required amiodarone gtt and DBA for BP support. RHC/LHC showed normal coronaries, elevated filling pressures with normal cardiac output, an EF < 25%. Suspected tachy-medicated. 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 Drips weaned off, GDMT titrated. Discharged home, weight 198 lbs.  Today he returns for HF follow up. Feels great. Goes to the gym and working out full body. No CP or SOB. No edema. Gets dizzy when bends over. 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 Moderate MR  - R/LHC (6/23): EF 25%, normal cors   Ao = 95/68 (81) LV = 91/23 RA =  8 RV = 51/10  PA = 51/23 (36) PCW = 26 (v = 45) Fick cardiac output/index = 5.7/2.6 PVR = 1.9 WU Ao sat = 93% PA sat = 67%, 67%   Assessment: 1. Normal coronary arteries  2. Severe NICM EF < 25% 3. Elevated filling pressures with normal cardiac output  4. 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 Continue diuresis. Check cMRI.   - Echo (6/23): EF 25-30%, severe LV dysfunction with global HK, RV moderately reduced, mild to moderate MR  Past Medical History:  Diagnosis Date   Hyperlipidemia    Current Outpatient Medications  Medication Sig Dispense Refill   amiodarone (PACERONE) 200 MG  tablet Take 1 tablet (200 mg total) by mouth daily. 90 tablet 3   apixaban (ELIQUIS) 5 MG TABS tablet Take 1 tablet (5 mg total) by mouth 2 (two) times daily. 180 tablet 3   carvedilol (COREG) 3.125 MG tablet Take 1 tablet (3.125 mg total) by mouth 2 (two) times daily. 180 tablet 1   dapagliflozin propanediol (FARXIGA) 10 MG TABS tablet Take 1 tablet (10 mg total) by mouth daily before breakfast. 90 tablet 3   digoxin (LANOXIN) 0.125 MG tablet Take 1 tablet (0.125 mg total) by mouth daily. 90 tablet 3   furosemide (LASIX) 40 MG tablet Take 1 tablet (40 mg total) by mouth daily as needed. 90 tablet 3   sacubitril-valsartan (ENTRESTO) 49-51 MG Take 1 tablet by mouth 2 (two) times daily. 180 tablet 3   No current facility-administered medications for this encounter.   No Known Allergies  Social History   Socioeconomic History   Marital status: Single    Spouse name: Not on file   Number of children: Not on file   Years of education: Not on file   Highest education level: Not on file  Occupational History   Not on file  Tobacco Use   Smoking status: Former    Types: Cigarettes    Quit date: 09/24/1987    Years since quitting: 34.5   Smokeless tobacco: Never   Tobacco comments:    Smoked from age 34 until  age 75  Substance and Sexual Activity   Alcohol use: No   Drug use: No   Sexual activity: Yes    Partners: Male  Other Topics Concern   Not on file  Social History Narrative   Not on file   Social Determinants of Health   Financial Resource Strain: Not on file  Food Insecurity: Not on file  Transportation Needs: Not on file  Physical Activity: Not on file  Stress: Not on file  Social Connections: Not on file  Intimate Partner Violence: Not on file   Family History  Problem Relation Age of Onset   Hyperlipidemia Mother    Breast cancer Mother    Cancer Mother    Hyperlipidemia Father    Lung cancer Father        deceased   Cancer Father    BP (!) 156/84   Pulse  (!) 53   Wt 97.1 kg (214 lb)   SpO2 97%   BMI 29.85 kg/m   Wt Readings from Last 3 Encounters:  03/26/22 97.1 kg (214 lb)  02/03/22 93.4 kg (206 lb)  01/13/22 93.9 kg (207 lb)   PHYSICAL EXAM: General:  Well appearing. No resp difficulty HEENT: normal Neck: supple. no JVD. Carotids 2+ bilat; no bruits. No lymphadenopathy or thryomegaly appreciated. Cor: PMI nondisplaced. Regular rate & rhythm. No rubs, gallops or murmurs. Lungs: clear Abdomen: soft, nontender, nondistended. No hepatosplenomegaly. No bruits or masses. Good bowel sounds. Extremities: no cyanosis, clubbing, rash, edema Neuro: alert & orientedx3, cranial nerves grossly intact. moves all 4 extremities w/o difficulty. Affect pleasant  ECG (personally reviewed): Sinus brady 55 Personally reviewed  ASSESSMENT & PLAN:  1. 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 - New diagnosis. Suspect tachy-mediated, admitted with AF with RVR.  - R/LHC (6/23): --> normal coronaries, elevated filling pressures with normal cardiac output, an EF < 25%. Suspected tachy-medicated.  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 Moderate MR - Echo today 03/26/22 EF 55% Personally reviewed (EF recovered with maintenance of SR) - NYHA I, volume looks good today off lasix - Continue carvedilol 3.125 mg bid. - Increase Entresto 97/103 bid - Continue Farxiga 10 mg daily. - Stop digoxin - Off spiro with elevated K.   2. 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 - Chemically converted with amio.  - Remains in NSR on ECG today.  - Continue amio 200 mg daily. - Continue Eliquis. No bleeding. - Refer to EP for consideration of AF ablation so we can stop amio - Check sleep study   3. CKD 3a - Baseline Scr  1.6. - Labs today.   4. PVCs, rare  5. Snoring - check sleep study  6. HTN - BP up.  - Increase Jeanie Sewer, MD  3:49 PM

## 2022-04-01 ENCOUNTER — Telehealth (HOSPITAL_COMMUNITY): Payer: Self-pay

## 2022-04-01 NOTE — Telephone Encounter (Signed)
Patient called for an update on disability paperwork. Please advise.

## 2022-04-03 ENCOUNTER — Encounter (HOSPITAL_COMMUNITY): Payer: Self-pay | Admitting: *Deleted

## 2022-04-03 NOTE — Telephone Encounter (Signed)
Forms have been completed, signed by Dr Haroldine Laws, and faxed in to [place], [place] mess sent to pt, copy left at front desk for pt to pick up

## 2022-04-09 ENCOUNTER — Encounter: Payer: Self-pay | Admitting: Internal Medicine

## 2022-06-17 ENCOUNTER — Other Ambulatory Visit (HOSPITAL_COMMUNITY): Payer: Self-pay | Admitting: Family Medicine

## 2022-06-25 ENCOUNTER — Encounter: Payer: Self-pay | Admitting: Cardiology

## 2022-06-25 ENCOUNTER — Ambulatory Visit: Payer: Medicaid Other | Attending: Cardiology | Admitting: Cardiology

## 2022-06-25 ENCOUNTER — Other Ambulatory Visit (HOSPITAL_COMMUNITY): Payer: Self-pay

## 2022-06-25 VITALS — BP 138/80 | HR 54 | Ht 71.0 in | Wt 228.4 lb

## 2022-06-25 DIAGNOSIS — I480 Paroxysmal atrial fibrillation: Secondary | ICD-10-CM | POA: Diagnosis not present

## 2022-06-25 DIAGNOSIS — I4891 Unspecified atrial fibrillation: Secondary | ICD-10-CM | POA: Diagnosis not present

## 2022-06-25 NOTE — Patient Instructions (Addendum)
Medication Instructions:  Your physician recommends that you continue on your current medications as directed. Please refer to the Current Medication list given to you today.  *If you need a refill on your cardiac medications before your next appointment, please call your pharmacy*   Lab Work: None ordered.  If you have labs (blood work) drawn today and your tests are completely normal, you will receive your results only by: [place] (if you have MyChart) OR A paper copy in the mail If you have any lab test that is abnormal or we need to change your treatment, we will call you to review the results.   Testing/Procedures: Your physician has recommended that you have an ablation. Catheter ablation is a medical procedure used to treat some cardiac arrhythmias (irregular heartbeats). During catheter ablation, a long, thin, flexible tube is put into a blood vessel in your groin (upper thigh), or neck. This tube is called an ablation catheter. It is then guided to your heart through the blood vessel. Radio frequency waves destroy small areas of heart tissue where abnormal heartbeats may cause an arrhythmia to start. Please see the instruction sheet given to you today.     Follow-Up: At [facility], you and your health needs are our priority.  As part of our continuing mission to provide you with exceptional heart care, we have created designated Provider Care Teams.  These Care Teams include your primary Cardiologist (physician) and Advanced Practice Providers (APPs -  Physician Assistants and Nurse Practitioners) who all work together to provide you with the care you need, when you need it.  We recommend signing up for the patient portal called "MyChart".  Sign up information is provided on this After Visit Summary.  MyChart is used to connect with patients for Virtual Visits (Telemedicine).  Patients are able to view lab/test results, encounter notes, upcoming appointments, etc.   Non-urgent messages can be sent to your provider as well.   To learn more about what you can do with MyChart, go to [email or web address].    Your next appointment:   To be scheduled  Other Instructions Afib Ablation tentatively scheduled for 09/22/2022

## 2022-06-25 NOTE — Progress Notes (Signed)
Electrophysiology Office Note:    Date:  06/25/2022   ID:  Adam Henson, DOB 1961-05-07, MRN [id number]  PCP:  Pcp, No  [facility] Cardiologist:  None  [facility] Electrophysiologist:  Lanier Prude, MD   Referring MD: No ref. provider found   Chief Complaint: Possible afib ablation  History of Present Illness:    Adam Henson is a 62 y.o. male who presents for an evaluation of possible afib ablation at the request of Bensimhon, Bevelyn Buckles, MD. 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   Admitted 6/23 with new acute systolic heart failure. He was found to be in atrial fibrillation with RVR rates in 160s-170s. Diuresed with IV lasix and started on Dilt gtt, later switched to metoprolol for rate control. Planned for TEE/DCCV, TEE EF < 20%, moderate MR, large LAA thrombus; no DCCV. Required amiodarone gtt and DBA for BP support. RHC/LHC showed normal coronaries, elevated filling pressures with normal cardiac output, an EF < 25%. Suspected tachy-medicated. 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 Drips weaned off, GDMT titrated. Discharged home, weight 198 lbs.   He was seen by Dolores Patty, MD on 03/26/2022 for HF follow-up. He complained of some dizziness when bending over. His BP at home was 140s/70-80s. Echo 03/26/22 EF 55%.   Today, he previously had Afib and is currently on Amiodarone 200 MG and is working on getting off of it. His pump function on recent Echo looked normal. He endorsed some previous leg swelling at the bilateral lower extremities, which radiated towards his torso. He is currently feeling great and active. He is tolerating Eliquis 5 mg. Has not taken lasix 40 mg since in the hospital. He notes some HTN in the family. His mother died of breast cancer and his father of lung cancer. He does not drink alcohol or smoke. He denies any chest pain or shortness of  breath. No lightheadedness, headaches, syncope, orthopnea, or PND.     Past Medical History:  Diagnosis Date   Hyperlipidemia     Past Surgical History:  Procedure Laterality Date   JOINT REPLACEMENT     MANDIBLE FRACTURE SURGERY     RIGHT/LEFT HEART CATH AND CORONARY ANGIOGRAPHY N/A 12/05/2021   Procedure: RIGHT/LEFT HEART CATH AND CORONARY ANGIOGRAPHY;  Surgeon: Dolores Patty, MD;  Location: [facility]VASIVE [facility];  Service: Cardiovascular;  Laterality: N/A;   TEE WITHOUT CARDIOVERSION N/A 12/03/2021   Procedure: TRANSESOPHAGEAL ECHOCARDIOGRAM (TEE);  Surgeon: Elease Hashimoto Deloris Ping, MD;  Location: [facility] ENDOSCOPY;  Service: Cardiovascular;  Laterality: N/A;    Current Medications: No outpatient medications have been marked as taking for the 06/25/22 encounter (Office Visit) with Lanier Prude, MD.     Allergies:   Patient has no known allergies.   Social History   Socioeconomic History   Marital status: Single    Spouse name: Not on file   Number of children: Not on file   Years of education: Not on file   Highest education level: Not on file  Occupational History   Not on file  Tobacco Use   Smoking status: Former    Types: Cigarettes    Quit date: 09/24/1987    Years since quitting: 34.7   Smokeless tobacco: Never   Tobacco comments:    Smoked from age 89 until age 15  Substance and Sexual Activity   Alcohol use: No  Drug use: No   Sexual activity: Yes    Partners: Male  Other Topics Concern   Not on file  Social History Narrative   Not on file   Social Determinants of Health   Financial Resource Strain: Not on file  Food Insecurity: Not on file  Transportation Needs: Not on file  Physical Activity: Not on file  Stress: Not on file  Social Connections: Not on file     Family History: The patient's family history includes Breast cancer in his mother; Cancer in his father and mother; Hyperlipidemia in his father and mother; Lung cancer in his  father.  ROS:   Please see the history of present illness.    (+) bilateral LE edema All other systems reviewed and are negative.  EKGs/Labs/Other Studies Reviewed:    The following studies were reviewed today:  Echocardiogram 03/26/2022: IMPRESSIONS     1. Left ventricular ejection fraction by 3D volume is 54 %. The left  ventricle has low normal function. The left ventricle has no regional wall  motion abnormalities. The left ventricular internal cavity size was  moderately dilated. Left ventricular  diastolic parameters are consistent with Grade I diastolic dysfunction  (impaired relaxation). The average left ventricular global longitudinal  strain is -19.6 %. The global longitudinal strain is normal.   2. Right ventricular systolic function is normal. The right ventricular  size is mildly enlarged. There is normal pulmonary artery systolic  pressure. The estimated right ventricular systolic pressure is 47.4 mmHg.   3. Left atrial size was mildly dilated.   4. The mitral valve is normal in structure. Mild mitral valve  regurgitation. No evidence of mitral stenosis.   5. The aortic valve is tricuspid. Aortic valve regurgitation is not  visualized. No aortic stenosis is present.   6. The inferior vena cava is normal in size with greater than 50%  respiratory variability, suggesting right atrial pressure of 3 mmHg.   Comparison(s): Prior images reviewed side by side. The left ventricular function has improved. Prior EF 25%.   MR Cardiac Velocity Flow Map 12/06/2021: IMPRESSION:   1.  Severe LV dilatation with severe systolic dysfunction (EF 25%)   2.  Normal RV size with severe systolic dysfunction (EF 95%)   3. Subepicardial late gadolinium enhancement in mid   inferior/inferolateral wall. This is a nonischemic scar pattern, and   location of scar is often seen in myocarditis. No elevation of   myocardial T2 values to suggest acute myocarditis. Suspect prior    myocarditis.   4. RV insertion site LGE, which is a nonspecific scar pattern often   seen in setting of elevated pulmonary pressures   5. Moderate mitral regurgitation (regurgitant volume 29cc,   regurgitant fraction 38%)   6.  Small pericardial effusion   EKG:   EKG is personally reviewed.  06/25/2022:  Sinus rhythm 03/26/2022: Sinus bradycardia, 55 bpm (Bensimhon, Shaune Pascal, MD)  Recent Labs: 11/28/2021: TSH 3.081 12/07/2021: Magnesium 2.1 12/23/2021: Hemoglobin 17.4; Platelets 226 03/26/2022: ALT 21; B Natriuretic Peptide 29.3; BUN 16; Creatinine, Ser 1.53; Potassium 4.3; Sodium 137   Recent Lipid Panel    Component Value Date/Time   CHOL 152 12/02/2021 0406   TRIG 59 12/02/2021 0406   HDL 38 (L) 12/02/2021 0406   CHOLHDL 4.0 12/02/2021 0406   VLDL 12 12/02/2021 0406   LDLCALC 102 (H) 12/02/2021 0406    Physical Exam:    VS:  BP 138/80   Pulse (!) 54   Ht  5\' 11"  (1.803 m)   Wt 228 lb 6.4 oz (103.6 kg)   SpO2 96%   BMI 31.86 kg/m     Wt Readings from Last 3 Encounters:  06/25/22 228 lb 6.4 oz (103.6 kg)  03/26/22 214 lb (97.1 kg)  02/03/22 206 lb (93.4 kg)     GEN: Well nourished, well developed in no acute distress HEENT: Normal NECK: No JVD; No carotid bruits LYMPHATICS: No lymphadenopathy CARDIAC: RRR, no murmurs, rubs, gallops RESPIRATORY:  Clear to auscultation without rales, wheezing or rhonchi  ABDOMEN: Soft, non-tender, non-distended MUSCULOSKELETAL:  No edema; No deformity  SKIN: Warm and dry NEUROLOGIC:  Alert and oriented x 3 PSYCHIATRIC:  Normal affect       ASSESSMENT:    1. Atrial fibrillation with RVR (HCC)   2. Paroxysmal atrial fibrillation (HCC)    PLAN:    #Persistent AF Rhythm control indicated given tachy-mediated cardiomyopathy history. Continue eliquis.  Discussed treatment options today for their AF including antiarrhythmic drug therapy and ablation. Discussed risks, recovery and likelihood of success. Discussed  potential need for repeat ablation procedures and antiarrhythmic drugs after an initial ablation. They wish to proceed with scheduling.  Risk, benefits, and alternatives to EP study and radiofrequency ablation for afib were also discussed in detail today. These risks include but are not limited to stroke, bleeding, vascular damage, tamponade, perforation, damage to the esophagus, lungs, and other structures, pulmonary vein stenosis, worsening renal function, and death. The patient understands these risk and wishes to proceed.  We will therefore proceed with catheter ablation at the next available time.  Carto, ICE, anesthesia are requested for the procedure.  Will also obtain CT PV protocol prior to the procedure to exclude LAA thrombus and further evaluate atrial anatomy.    Medication Adjustments/Labs and Tests Ordered: Current medicines are reviewed at length with the patient today.  Concerns regarding medicines are outlined above.   Orders Placed This Encounter  Procedures   EKG 12-Lead   No orders of the defined types were placed in this encounter.   I,Mitra Faeizi,acting as a Education administrator for Vickie Epley, MD.,have documented all relevant documentation on the behalf of Vickie Epley, MD,as directed by  Vickie Epley, MD while in the presence of Vickie Epley, MD.  I, Vickie Epley, MD, have reviewed all documentation for this visit. The documentation on 06/25/22 for the exam, diagnosis, procedures, and orders are all accurate and complete.   Signed, Hilton Cork. Quentin Ore, MD, [facility], [facility] 06/25/2022 10:27 PM    Electrophysiology [facility]

## 2022-06-27 ENCOUNTER — Other Ambulatory Visit (HOSPITAL_COMMUNITY): Payer: Self-pay

## 2022-06-27 ENCOUNTER — Other Ambulatory Visit (HOSPITAL_COMMUNITY): Payer: Self-pay | Admitting: *Deleted

## 2022-06-27 ENCOUNTER — Telehealth (HOSPITAL_COMMUNITY): Payer: Self-pay | Admitting: Pharmacy Technician

## 2022-06-27 MED ORDER — ENTRESTO 97-103 MG PO TABS: 1.0000 | ORAL_TABLET | Freq: Two times a day (BID) | ORAL | 3 refills | Status: DC

## 2022-06-27 NOTE — Telephone Encounter (Signed)
Advanced Heart Failure Patient Advocate Encounter  Prior Authorization for Delene Loll has been approved through [employer]..    Effective dates: 06/27/22 through 11/14/22  Patients co-pay is $4 (90 days)  Called and updated patient. Sent 90 day RX request to [facility] to send to [place], CPhT

## 2022-06-27 NOTE — Telephone Encounter (Signed)
Patient Advocate Encounter   Received notification from [place] that prior authorization for Delene Loll is required.   PA submitted on CoverMyMeds Key [id number] Status is pending  Patient Advocate Encounter   Received notification from [place] that prior authorization for Delene Loll is required.   PA submitted on CoverMyMeds Key [id number] Status is pending  Not sure which insurance is primary, as both state PA is required. Will continue to follow.

## 2022-06-30 NOTE — Telephone Encounter (Signed)
Advanced Heart Failure Patient Advocate Encounter  Prior Authorization for Adam Henson has been approved through [employer].    PA# [id number] Effective dates: 06/27/22 through 06/27/23  Charlann Boxer, CPhT

## 2022-07-25 NOTE — Progress Notes (Signed)
ADVANCED HF CLINIC NOTE   Primary Care: Pcp, No HF Cardiologist: Dr. Haroldine Laws  HPI: Adam Henson is a 62 y.o. 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  He was found to be in atrial fibrillation with RVR rates in 160s-170s. Diuresed with IV lasix and started on Dilt gtt, later switched to metoprolol for rate control. Planned for TEE/DCCV, TEE EF < 20%, moderate MR, large LAA thrombus; no DCCV. Required amiodarone gtt and DBA for BP support. RHC/LHC showed normal coronaries, elevated filling pressures with normal cardiac output, an EF < 25%. Suspected tachy-medicated. 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 Drips weaned off, GDMT titrated. Discharged home, weight 198 lbs.  Today he returns for HF follow up. Feels great. Goes to the gym and working out full body. No CP or SOB. No edema. Gets dizzy when bends over. 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 Moderate MR  - R/LHC (6/23): EF 25%, normal cors   Ao = 95/68 (81) LV = 91/23 RA =  8 RV = 51/10  PA = 51/23 (36) PCW = 26 (v = 45) Fick cardiac output/index = 5.7/2.6 PVR = 1.9 WU Ao sat = 93% PA sat = 67%, 67%   Assessment: 1. Normal coronary arteries  2. Severe NICM EF < 25% 3. Elevated filling pressures with normal cardiac output  4. 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 Continue diuresis. Check cMRI.   - Echo (6/23): EF 25-30%, severe LV dysfunction with global HK, RV moderately reduced, mild to moderate MR  Past Medical History:  Diagnosis Date   Hyperlipidemia    Current Outpatient Medications  Medication Sig Dispense Refill   amiodarone (PACERONE) 200 MG  tablet Take 1 tablet (200 mg total) by mouth daily. 90 tablet 3   apixaban (ELIQUIS) 5 MG TABS tablet Take 1 tablet (5 mg total) by mouth 2 (two) times daily. 180 tablet 3   carvedilol (COREG) 3.125 MG tablet TAKE 1 TABLET BY MOUTH 2 TIMES DAILY. 180 tablet 1   dapagliflozin propanediol (FARXIGA) 10 MG TABS tablet Take 1 tablet (10 mg total) by mouth daily before breakfast. 90 tablet 3   furosemide (LASIX) 40 MG tablet Take 1 tablet (40 mg total) by mouth daily as needed. 90 tablet 3   sacubitril-valsartan (ENTRESTO) 97-103 MG Take 1 tablet by mouth 2 (two) times daily. 90 tablet 3   No current facility-administered medications for this visit.   No Known Allergies  Social History   Socioeconomic History   Marital status: Single    Spouse name: Not on file   Number of children: Not on file   Years of education: Not on file   Highest education level: Not on file  Occupational History   Not on file  Tobacco Use   Smoking status: Former    Types: Cigarettes    Quit date: 09/24/1987    Years since quitting: 34.8   Smokeless tobacco: Never   Tobacco comments:    Smoked from age 78 until age 49  Substance and Sexual Activity   Alcohol use: No   Drug use: No   Sexual activity: Yes  Partners: Male  Other Topics Concern   Not on file  Social History Narrative   Not on file   Social Determinants of Health   Financial Resource Strain: Not on file  Food Insecurity: Not on file  Transportation Needs: Not on file  Physical Activity: Not on file  Stress: Not on file  Social Connections: Not on file  Intimate Partner Violence: Not on file   Family History  Problem Relation Age of Onset   Hyperlipidemia Mother    Breast cancer Mother    Cancer Mother    Hyperlipidemia Father    Lung cancer Father        deceased   Cancer Father    There were no vitals taken for this visit.  Wt Readings from Last 3 Encounters:  06/25/22 103.6 kg (228 lb 6.4 oz)  03/26/22 97.1 kg (214 lb)   02/03/22 93.4 kg (206 lb)   PHYSICAL EXAM: General:  Well appearing. No resp difficulty HEENT: normal Neck: supple. no JVD. Carotids 2+ bilat; no bruits. No lymphadenopathy or thryomegaly appreciated. Cor: PMI nondisplaced. Regular rate & rhythm. No rubs, gallops or murmurs. Lungs: clear Abdomen: soft, nontender, nondistended. No hepatosplenomegaly. No bruits or masses. Good bowel sounds. Extremities: no cyanosis, clubbing, rash, edema Neuro: alert & orientedx3, cranial nerves grossly intact. moves all 4 extremities w/o difficulty. Affect pleasant  ECG (personally reviewed): Sinus brady 55 Personally reviewed  ASSESSMENT & PLAN:  1. 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 - New diagnosis. Suspect tachy-mediated, admitted with AF with RVR.  - R/LHC (6/23): --> normal coronaries, elevated filling pressures with normal cardiac output, an EF < 25%. Suspected tachy-medicated.  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 Moderate MR - Echo today 03/26/22 EF 55% Personally reviewed (EF recovered with maintenance of SR) - NYHA I, volume looks good today off lasix - Continue carvedilol 3.125 mg bid. - Increase Entresto 97/103 bid - Continue Farxiga 10 mg daily. - Stop digoxin - Off spiro with elevated K.   2. 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 - Chemically converted with amio.  - Remains in NSR on ECG today.  - Continue amio 200 mg daily. - Continue Eliquis. No bleeding. - Refer to EP for consideration of AF ablation so we can stop amio - Check sleep study   3. CKD 3a - Baseline Scr 1.6. - Labs today.   4. PVCs, rare  5. Snoring - check sleep study  6. HTN - BP up.  - Increase Entresto   Rafael Bihari, FNP  9:40 AM

## 2022-07-28 ENCOUNTER — Ambulatory Visit (HOSPITAL_COMMUNITY)
Admission: RE | Admit: 2022-07-28 | Discharge: 2022-07-28 | Disposition: A | Discharge status: Home / Self Care | Payer: No Typology Code available for payment source | Source: Ambulatory Visit | Attending: Family Medicine | Admitting: Family Medicine

## 2022-07-28 ENCOUNTER — Encounter (HOSPITAL_COMMUNITY): Payer: Self-pay

## 2022-07-28 VITALS — BP 130/70 | HR 58 | Wt 227.0 lb

## 2022-07-28 DIAGNOSIS — R0683 Snoring: Secondary | ICD-10-CM

## 2022-07-28 DIAGNOSIS — I130 Hypertensive heart and chronic kidney disease with heart failure and stage 1 through stage 4 chronic kidney disease, or unspecified chronic kidney disease: Secondary | ICD-10-CM | POA: Diagnosis not present

## 2022-07-28 DIAGNOSIS — I480 Paroxysmal atrial fibrillation: Secondary | ICD-10-CM

## 2022-07-28 DIAGNOSIS — N1831 Chronic kidney disease, stage 3a: Secondary | ICD-10-CM | POA: Diagnosis present

## 2022-07-28 DIAGNOSIS — I493 Ventricular premature depolarization: Secondary | ICD-10-CM | POA: Diagnosis not present

## 2022-07-28 DIAGNOSIS — Q2112 Patent foramen ovale: Secondary | ICD-10-CM | POA: Diagnosis not present

## 2022-07-28 DIAGNOSIS — Z7901 Long term (current) use of anticoagulants: Secondary | ICD-10-CM | POA: Insufficient documentation

## 2022-07-28 DIAGNOSIS — I10 Essential (primary) hypertension: Secondary | ICD-10-CM

## 2022-07-28 DIAGNOSIS — Z79899 Other long term (current) drug therapy: Secondary | ICD-10-CM | POA: Diagnosis not present

## 2022-07-28 DIAGNOSIS — I5022 Chronic systolic (congestive) heart failure: Secondary | ICD-10-CM | POA: Diagnosis present

## 2022-07-28 DIAGNOSIS — Z8679 Personal history of other diseases of the circulatory system: Secondary | ICD-10-CM | POA: Diagnosis not present

## 2022-07-28 LAB — COMPREHENSIVE METABOLIC PANEL
ALT: 23 U/L (ref 0–44)
AST: 48 U/L — ABNORMAL HIGH (ref 15–41)
Albumin: 4.1 g/dL (ref 3.5–5.0)
Alkaline Phosphatase: 52 U/L (ref 38–126)
Anion gap: 8 (ref 5–15)
BUN: 20 mg/dL (ref 8–23)
CO2: 27 mmol/L (ref 22–32)
Calcium: 9.1 mg/dL (ref 8.9–10.3)
Chloride: 103 mmol/L (ref 98–111)
Creatinine, Ser: 1.61 mg/dL — ABNORMAL HIGH (ref 0.61–1.24)
GFR, Estimated: 48 mL/min — ABNORMAL LOW (ref >60)
Glucose, Bld: 93 mg/dL (ref 70–99)
Potassium: 5.1 mmol/L (ref 3.5–5.1)
Sodium: 138 mmol/L (ref 135–145)
Total Bilirubin: 0.8 mg/dL (ref 0.3–1.2)
Total Protein: 7.4 g/dL (ref 6.5–8.1)

## 2022-07-28 LAB — CBC
HCT: 47.5 % (ref 39.0–52.0)
Hemoglobin: 15.6 g/dL (ref 13.0–17.0)
MCH: 29.5 pg (ref 26.0–34.0)
MCHC: 32.8 g/dL (ref 30.0–36.0)
MCV: 90 fL (ref 80.0–100.0)
Platelets: 187 10*3/uL (ref 150–400)
RBC: 5.28 MIL/uL (ref 4.22–5.81)
RDW: 13.2 % (ref 11.5–15.5)
WBC: 7.9 10*3/uL (ref 4.0–10.5)
nRBC: 0 % (ref 0.0–0.2)

## 2022-07-28 LAB — TSH: TSH: 2.055 u[IU]/mL (ref 0.350–4.500)

## 2022-07-28 NOTE — Patient Instructions (Signed)
Great to see you today! Labs completed.  We will only call with abnormal values.  Please call in May to schedule a follow-up appt with Dr. Haroldine Laws for June.    Please complete the ordered home sleep study as instructed.  At the [facility], you and your health needs are our priority. We have a designated team specialized in the treatment of Heart Failure. This Care Team includes your primary Heart Failure Specialized Cardiologist (physician), Advanced Practice Providers (APPs- Physician Assistants and Nurse Practitioners), and Pharmacist who all work together to provide you with the care you need, when you need it.   You may see any of the following providers on your designated Care Team at your next follow up:  Dr. Glori Bickers Dr. Loralie Champagne Dr. Roxana Hires, NP Lyda Jester, [place] [facility] [place], [place] Forestine Na, NP Audry Riles, PharmD   Please be sure to bring in all your medications bottles to every appointment.   Need to Contact us:  If you have any questions or concerns before your next appointment please send [place] a message through [place] or call our office at [phone].    TO LEAVE A MESSAGE FOR THE NURSE SELECT OPTION 2, PLEASE LEAVE A MESSAGE INCLUDING: YOUR NAME DATE OF BIRTH CALL BACK NUMBER REASON FOR CALL**this is important as we prioritize the call backs  YOU WILL RECEIVE A CALL BACK THE SAME DAY AS LONG AS YOU CALL BEFORE 4:00 PM

## 2022-08-08 ENCOUNTER — Other Ambulatory Visit (HOSPITAL_COMMUNITY): Payer: Self-pay | Admitting: Family Medicine

## 2022-08-27 ENCOUNTER — Other Ambulatory Visit: Payer: Self-pay

## 2022-08-27 DIAGNOSIS — I480 Paroxysmal atrial fibrillation: Secondary | ICD-10-CM

## 2022-08-28 ENCOUNTER — Telehealth: Payer: Self-pay

## 2022-08-28 DIAGNOSIS — I480 Paroxysmal atrial fibrillation: Secondary | ICD-10-CM

## 2022-08-28 DIAGNOSIS — Z01812 Encounter for preprocedural laboratory examination: Secondary | ICD-10-CM

## 2022-08-28 NOTE — Telephone Encounter (Signed)
LM for pt to call back to discuss upcoming Ablation. He needs to schedule a lab appt prior to 4/1.

## 2022-09-01 NOTE — Telephone Encounter (Signed)
2nd attempt to call pt. LM advising him to call back ASAP. He is scheduled for his CT on 4/1 and we need labs prior to that appt.

## 2022-09-04 NOTE — Telephone Encounter (Signed)
Order lab work for patient to have done at [facility] next week. Patient will get done at [facility] in [place] next week. Patient also wanted to know if he is suppose to go to get his CT at [facility] or at location off [facility]. Patient also needs instructions for CT. Will forward to Carly to help with this.

## 2022-09-04 NOTE — Telephone Encounter (Signed)
Patient is returning call and is requesting return call.  

## 2022-09-04 NOTE — Addendum Note (Signed)
Addended by: Aris Georgia, Ikesha Siller L on: 09/04/2022 03:17 PM   Modules accepted: Orders

## 2022-09-05 ENCOUNTER — Telehealth: Payer: Self-pay | Admitting: Cardiology

## 2022-09-05 NOTE — Telephone Encounter (Signed)
Left message for patient to call back.  Instruction letters have been sent in [place] and placed in the mail.  CT scan is scheduled at [facility].

## 2022-09-05 NOTE — Telephone Encounter (Signed)
patient calling back to get instruction letter and time/location of CT scan

## 2022-09-05 NOTE — Telephone Encounter (Signed)
See previous phone note.  

## 2022-09-05 NOTE — Telephone Encounter (Signed)
Left message for patient advising on instruction letter and time/location of CT scan. Advised to call back with further questions.

## 2022-09-10 LAB — BASIC METABOLIC PANEL
BUN/Creatinine Ratio: 13 (ref 10–24)
BUN: 21 mg/dL (ref 8–27)
CO2: 23 mmol/L (ref 20–29)
Calcium: 9.1 mg/dL (ref 8.6–10.2)
Chloride: 104 mmol/L (ref 96–106)
Creatinine, Ser: 1.56 mg/dL — ABNORMAL HIGH (ref 0.76–1.27)
Glucose: 100 mg/dL — ABNORMAL HIGH (ref 70–99)
Potassium: 4.7 mmol/L (ref 3.5–5.2)
Sodium: 141 mmol/L (ref 134–144)
eGFR: 50 mL/min/{1.73_m2} — ABNORMAL LOW (ref >59)

## 2022-09-12 ENCOUNTER — Telehealth (HOSPITAL_COMMUNITY): Payer: Self-pay

## 2022-09-12 ENCOUNTER — Telehealth (HOSPITAL_COMMUNITY): Payer: Self-pay | Admitting: *Deleted

## 2022-09-12 NOTE — Telephone Encounter (Signed)
Attempted to call patient regarding upcoming cardiac CT appointment. °Left message on voicemail with name and callback number ° °Jami Bogdanski RN Navigator Cardiac Imaging °[facility] °[phone] Office °[phone] Cell ° °

## 2022-09-12 NOTE — Telephone Encounter (Signed)
error 

## 2022-09-12 NOTE — Telephone Encounter (Signed)
Reaching out to patient to offer assistance regarding upcoming cardiac imaging study; pt verbalizes understanding of appt date/time, parking situation and where to check in, pre-test NPO status, and verified current allergies; name and call back number provided for further questions should they arise  Trine Fread RN Navigator Cardiac Imaging [facility] [phone] office [phone] cell  Patient aware to arrive at 11am. 

## 2022-09-12 NOTE — Telephone Encounter (Addendum)
Spoke with patient and informed him that he was released back to work last October and due to recovery of his EF.   Left message for patient to call office back to get more information about what is needed in regards to his disability paper work

## 2022-09-15 ENCOUNTER — Ambulatory Visit (HOSPITAL_COMMUNITY)
Admission: RE | Admit: 2022-09-15 | Discharge: 2022-09-15 | Disposition: A | Discharge status: Home / Self Care | Payer: No Typology Code available for payment source | Source: Ambulatory Visit | Attending: Cardiology | Admitting: Cardiology

## 2022-09-15 DIAGNOSIS — I480 Paroxysmal atrial fibrillation: Secondary | ICD-10-CM | POA: Diagnosis not present

## 2022-09-15 MED ORDER — IOHEXOL 350 MG/ML SOLN
100.0000 mL | Freq: Once | INTRAVENOUS | Status: AC | PRN
  Administered 2022-09-15: 100 mL via INTRAVENOUS

## 2022-09-19 NOTE — Pre-Procedure Instructions (Signed)
Instructed patient on the following items: Arrival time 0830 Nothing to eat or drink after midnight No meds AM of procedure Responsible person to drive you home and stay with you for 24 hrs  Have you missed any doses of anti-coagulant Eliquis- hasn't missed any doses, don't take dose am of procedure

## 2022-09-22 ENCOUNTER — Other Ambulatory Visit: Payer: Self-pay

## 2022-09-22 ENCOUNTER — Ambulatory Visit (HOSPITAL_COMMUNITY)
Admission: RE | Admit: 2022-09-22 | Discharge: 2022-09-22 | Disposition: A | Discharge status: Home / Self Care | Payer: No Typology Code available for payment source | Attending: Cardiology | Admitting: Cardiology

## 2022-09-22 ENCOUNTER — Encounter (HOSPITAL_COMMUNITY)
Admission: RE | Disposition: A | Discharge status: Home / Self Care | Payer: Self-pay | Source: Home / Self Care | Attending: Cardiology

## 2022-09-22 ENCOUNTER — Ambulatory Visit (HOSPITAL_COMMUNITY): Payer: No Typology Code available for payment source | Admitting: Anesthesiology

## 2022-09-22 ENCOUNTER — Ambulatory Visit (HOSPITAL_BASED_OUTPATIENT_CLINIC_OR_DEPARTMENT_OTHER): Payer: No Typology Code available for payment source | Admitting: Anesthesiology

## 2022-09-22 DIAGNOSIS — I4819 Other persistent atrial fibrillation: Secondary | ICD-10-CM | POA: Diagnosis present

## 2022-09-22 DIAGNOSIS — Z79899 Other long term (current) drug therapy: Secondary | ICD-10-CM | POA: Diagnosis not present

## 2022-09-22 DIAGNOSIS — E785 Hyperlipidemia, unspecified: Secondary | ICD-10-CM | POA: Diagnosis not present

## 2022-09-22 DIAGNOSIS — I429 Cardiomyopathy, unspecified: Secondary | ICD-10-CM | POA: Insufficient documentation

## 2022-09-22 DIAGNOSIS — Z87891 Personal history of nicotine dependence: Secondary | ICD-10-CM

## 2022-09-22 DIAGNOSIS — I4891 Unspecified atrial fibrillation: Secondary | ICD-10-CM | POA: Diagnosis not present

## 2022-09-22 DIAGNOSIS — Z7901 Long term (current) use of anticoagulants: Secondary | ICD-10-CM | POA: Insufficient documentation

## 2022-09-22 DIAGNOSIS — I5022 Chronic systolic (congestive) heart failure: Secondary | ICD-10-CM | POA: Insufficient documentation

## 2022-09-22 HISTORY — PX: ATRIAL FIBRILLATION ABLATION: EP1191

## 2022-09-22 LAB — CBC
HCT: 49.1 % (ref 39.0–52.0)
Hemoglobin: 16.4 g/dL (ref 13.0–17.0)
MCH: 29.2 pg (ref 26.0–34.0)
MCHC: 33.4 g/dL (ref 30.0–36.0)
MCV: 87.4 fL (ref 80.0–100.0)
Platelets: 194 10*3/uL (ref 150–400)
RBC: 5.62 MIL/uL (ref 4.22–5.81)
RDW: 13.5 % (ref 11.5–15.5)
WBC: 7.6 10*3/uL (ref 4.0–10.5)
nRBC: 0 % (ref 0.0–0.2)

## 2022-09-22 LAB — POCT ACTIVATED CLOTTING TIME
Activated Clotting Time: 325 seconds
Activated Clotting Time: 331 seconds

## 2022-09-22 SURGERY — ATRIAL FIBRILLATION ABLATION
Anesthesia: General

## 2022-09-22 MED ORDER — HEPARIN (PORCINE) IN NACL 1000-0.9 UT/500ML-% IV SOLN
INTRAVENOUS | Status: DC | PRN
  Administered 2022-09-22 (×3): 500 mL

## 2022-09-22 MED ORDER — PHENYLEPHRINE HCL-NACL 20-0.9 MG/250ML-% IV SOLN
INTRAVENOUS | Status: DC | PRN
  Administered 2022-09-22: 25 ug/min via INTRAVENOUS

## 2022-09-22 MED ORDER — COLCHICINE 0.6 MG PO TABS
0.6000 mg | ORAL_TABLET | Freq: Two times a day (BID) | ORAL | Status: DC
  Administered 2022-09-22: 0.6 mg via ORAL
  Filled 2022-09-22: qty 1

## 2022-09-22 MED ORDER — SODIUM CHLORIDE 0.9% FLUSH: 3.0000 mL | INTRAVENOUS | Status: DC | PRN

## 2022-09-22 MED ORDER — PANTOPRAZOLE SODIUM 40 MG PO TBEC: 40.0000 mg | DELAYED_RELEASE_TABLET | Freq: Every day | ORAL | 0 refills | Status: DC

## 2022-09-22 MED ORDER — FENTANYL CITRATE (PF) 250 MCG/5ML IJ SOLN
INTRAMUSCULAR | Status: DC | PRN
  Administered 2022-09-22: 100 ug via INTRAVENOUS

## 2022-09-22 MED ORDER — APIXABAN 5 MG PO TABS
5.0000 mg | ORAL_TABLET | Freq: Two times a day (BID) | ORAL | Status: DC
  Administered 2022-09-22: 5 mg via ORAL
  Filled 2022-09-22: qty 1

## 2022-09-22 MED ORDER — ROCURONIUM BROMIDE 10 MG/ML (PF) SYRINGE
PREFILLED_SYRINGE | INTRAVENOUS | Status: DC | PRN
  Administered 2022-09-22: 60 mg via INTRAVENOUS

## 2022-09-22 MED ORDER — ONDANSETRON HCL 4 MG/2ML IJ SOLN: 4.0000 mg | Freq: Four times a day (QID) | INTRAMUSCULAR | Status: DC | PRN

## 2022-09-22 MED ORDER — HEPARIN SODIUM (PORCINE) 1000 UNIT/ML IJ SOLN
INTRAMUSCULAR | Status: DC | PRN
  Administered 2022-09-22: 3000 [IU] via INTRAVENOUS
  Administered 2022-09-22: 16000 [IU] via INTRAVENOUS

## 2022-09-22 MED ORDER — HEPARIN SODIUM (PORCINE) 1000 UNIT/ML IJ SOLN
INTRAMUSCULAR | Status: AC
  Filled 2022-09-22: qty 10

## 2022-09-22 MED ORDER — HEPARIN SODIUM (PORCINE) 1000 UNIT/ML IJ SOLN
INTRAMUSCULAR | Status: DC | PRN
  Administered 2022-09-22: 1000 [IU] via INTRAVENOUS

## 2022-09-22 MED ORDER — SODIUM CHLORIDE 0.9 % IV SOLN: INTRAVENOUS | Status: DC

## 2022-09-22 MED ORDER — ACETAMINOPHEN 325 MG PO TABS
650.0000 mg | ORAL_TABLET | ORAL | Status: DC | PRN
  Administered 2022-09-22: 650 mg via ORAL
  Filled 2022-09-22: qty 2

## 2022-09-22 MED ORDER — MIDAZOLAM HCL 5 MG/5ML IJ SOLN
INTRAMUSCULAR | Status: DC | PRN
  Administered 2022-09-22: 2 mg via INTRAVENOUS

## 2022-09-22 MED ORDER — PROTAMINE SULFATE 10 MG/ML IV SOLN
INTRAVENOUS | Status: DC | PRN
  Administered 2022-09-22: 35 mg via INTRAVENOUS

## 2022-09-22 MED ORDER — ACETAMINOPHEN 500 MG PO TABS
ORAL_TABLET | ORAL | Status: AC
  Administered 2022-09-22: 1000 mg
  Filled 2022-09-22: qty 2

## 2022-09-22 MED ORDER — PANTOPRAZOLE SODIUM 40 MG PO TBEC
40.0000 mg | DELAYED_RELEASE_TABLET | Freq: Every day | ORAL | Status: DC
  Administered 2022-09-22: 40 mg via ORAL
  Filled 2022-09-22: qty 1

## 2022-09-22 MED ORDER — PROPOFOL 10 MG/ML IV BOLUS
INTRAVENOUS | Status: DC | PRN
  Administered 2022-09-22: 150 mg via INTRAVENOUS

## 2022-09-22 MED ORDER — ONDANSETRON HCL 4 MG/2ML IJ SOLN
INTRAMUSCULAR | Status: DC | PRN
  Administered 2022-09-22: 4 mg via INTRAVENOUS

## 2022-09-22 MED ORDER — DEXAMETHASONE SODIUM PHOSPHATE 10 MG/ML IJ SOLN
INTRAMUSCULAR | Status: DC | PRN
  Administered 2022-09-22: 10 mg via INTRAVENOUS

## 2022-09-22 MED ORDER — LIDOCAINE 2% (20 MG/ML) 5 ML SYRINGE
INTRAMUSCULAR | Status: DC | PRN
  Administered 2022-09-22: 60 mg via INTRAVENOUS

## 2022-09-22 MED ORDER — SUGAMMADEX SODIUM 200 MG/2ML IV SOLN
INTRAVENOUS | Status: DC | PRN
  Administered 2022-09-22: 200 mg via INTRAVENOUS

## 2022-09-22 MED ORDER — COLCHICINE 0.6 MG PO TABS: 0.6000 mg | ORAL_TABLET | Freq: Two times a day (BID) | ORAL | 0 refills | Status: DC

## 2022-09-22 MED ORDER — SODIUM CHLORIDE 0.9 % IV SOLN: 250.0000 mL | INTRAVENOUS | Status: DC | PRN

## 2022-09-22 MED ORDER — SODIUM CHLORIDE 0.9% FLUSH: 3.0000 mL | Freq: Two times a day (BID) | INTRAVENOUS | Status: DC

## 2022-09-22 SURGICAL SUPPLY — 21 items
BAG SNAP BAND KOVER 36X36 (MISCELLANEOUS) IMPLANT
BLANKET WARM UNDERBOD FULL ACC (MISCELLANEOUS) ×2 IMPLANT
CATH 8FR REPROCESSED SOUNDSTAR (CATHETERS) ×1 IMPLANT
CATH 8FR SOUNDSTAR REPROCESSED (CATHETERS) IMPLANT
CATH ABLAT QDOT MICRO BI TC DF (CATHETERS) IMPLANT
CATH OCTARAY 2.0 F 3-3-3-3-3 (CATHETERS) IMPLANT
CATH S-M CIRCA TEMP PROBE (CATHETERS) IMPLANT
CATH WEBSTER BI DIR CS D-F CRV (CATHETERS) IMPLANT
CLOSURE PERCLOSE PROSTYLE (VASCULAR PRODUCTS) IMPLANT
COVER SWIFTLINK CONNECTOR (BAG) ×2 IMPLANT
MAT PREVALON FULL STRYKER (MISCELLANEOUS) IMPLANT
PACK EP LATEX FREE (CUSTOM PROCEDURE TRAY) ×1
PACK EP LF (CUSTOM PROCEDURE TRAY) ×2 IMPLANT
PAD DEFIB RADIO PHYSIO CONN (PAD) ×2 IMPLANT
PATCH CARTO3 (PAD) IMPLANT
SHEATH BAYLIS TRANSSEPTAL 98CM (NEEDLE) IMPLANT
SHEATH CARTO VIZIGO SM CVD (SHEATH) IMPLANT
SHEATH PINNACLE 8F 10CM (SHEATH) IMPLANT
SHEATH PINNACLE 9F 10CM (SHEATH) IMPLANT
SHEATH PROBE COVER 6X72 (BAG) IMPLANT
TUBING SMART ABLATE COOLFLOW (TUBING) IMPLANT

## 2022-09-22 NOTE — Anesthesia Postprocedure Evaluation (Signed)
Anesthesia Post Note  Patient: Adam Henson  Procedure(s) Performed: ATRIAL FIBRILLATION ABLATION     Patient location during evaluation: PACU Anesthesia Type: General Level of consciousness: awake and alert Pain management: pain level controlled Vital Signs Assessment: post-procedure vital signs reviewed and stable Respiratory status: spontaneous breathing, nonlabored ventilation, respiratory function stable and patient connected to nasal cannula oxygen Cardiovascular status: blood pressure returned to baseline and stable Postop Assessment: no apparent nausea or vomiting Anesthetic complications: no  No notable events documented.  Last Vitals:  Vitals:   09/22/22 1210 09/22/22 1215  BP: 122/76 124/74  Pulse: 60 (!) 59  Resp: 16 16  Temp:  36.7 C  SpO2: 96% 97%    Last Pain:  Vitals:   09/22/22 1215  TempSrc: Temporal  PainSc:                  Khaleed Holan,W. EDMOND

## 2022-09-22 NOTE — Anesthesia Procedure Notes (Signed)
Procedure Name: Intubation Date/Time: 09/22/2022 9:50 AM  Performed by: Quentin Ore, CRNAPre-anesthesia Checklist: Patient identified, Emergency Drugs available, Suction available and Patient being monitored Patient Re-evaluated:Patient Re-evaluated prior to induction Oxygen Delivery Method: Circle system utilized Preoxygenation: Pre-oxygenation with 100% oxygen Induction Type: IV induction Ventilation: Mask ventilation without difficulty and Oral airway inserted - appropriate to patient size Laryngoscope Size: Mac and 4 Grade View: Grade I Tube type: Oral Tube size: 7.5 mm Number of attempts: 1 Airway Equipment and Method: Stylet Placement Confirmation: ETT inserted through vocal cords under direct vision, positive ETCO2 and breath sounds checked- equal and bilateral Secured at: 22 cm Tube secured with: Tape Dental Injury: Teeth and Oropharynx as per pre-operative assessment

## 2022-09-22 NOTE — Discharge Instructions (Signed)

## 2022-09-22 NOTE — H&P (Signed)
Electrophysiology Office Note:     Date:  09/22/2022    ID:  Adam Henson, DOB [birth year], MRN [id number]   PCP:  Pcp, No          [facility] Cardiologist:  None  [facility] Electrophysiologist:  Lanier PrudeAMERON T Nkosi Cortright, MD    Referring MD: No ref. provider found    Chief Complaint: Possible afib ablation   History of Present Illness:     Adam Henson is a 62 y.o. male who presents for an evaluation of possible afib ablation at the request of Bensimhon, Bevelyn Bucklesaniel R, MD. 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    Admitted 6/23 with new acute systolic heart failure. He was found to be in atrial fibrillation with RVR rates in 160s-170s. Diuresed with IV lasix and started on Dilt gtt, later switched to metoprolol for rate control. Planned for TEE/DCCV, TEE EF < 20%, moderate MR, large LAA thrombus; no DCCV. Required amiodarone gtt and DBA for BP support. RHC/LHC showed normal coronaries, elevated filling pressures with normal cardiac output, an EF < 25%. Suspected tachy-medicated. 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 Drips weaned off, GDMT titrated. Discharged home, weight 198 lbs.    He was seen by Dolores PattyBensimhon, Daniel R, MD on 03/26/2022 for HF follow-up. He complained of some dizziness when bending over. His BP at home was 140s/70-80s. Echo 03/26/22 EF 55%.    Today, he previously had Afib and is currently on Amiodarone 200 MG and is working on getting off of it. His pump function on recent Echo looked normal. He endorsed some previous leg swelling at the bilateral lower extremities, which radiated towards his torso. He is currently feeling great and active. He is tolerating Eliquis 5 mg. Has not taken lasix 40 mg since in the hospital. He notes some HTN in the family. His mother died of breast cancer and his father of lung cancer. He does not drink alcohol or smoke. He denies any chest pain  or shortness of breath. No lightheadedness, headaches, syncope, orthopnea, or PND.    Presents for PVI today. Procedure reviewed.          Past Medical History:  Diagnosis Date   Hyperlipidemia             Past Surgical History:  Procedure Laterality Date   JOINT REPLACEMENT       MANDIBLE FRACTURE SURGERY       RIGHT/LEFT HEART CATH AND CORONARY ANGIOGRAPHY N/A 12/05/2021    Procedure: RIGHT/LEFT HEART CATH AND CORONARY ANGIOGRAPHY;  Surgeon: Dolores PattyBensimhon, Daniel R, MD;  [facility];  Service: Cardiovascular;  Laterality: N/A;   TEE WITHOUT CARDIOVERSION N/A 12/03/2021    Procedure: TRANSESOPHAGEAL ECHOCARDIOGRAM (TEE);  Surgeon: Elease HashimotoNahser, Deloris PingPhilip J, MD;  [facility];  Service: Cardiovascular;  Laterality: N/A;      Current Medications: Active Medications  No outpatient medications have been marked as taking for the 06/25/22 encounter (Office Visit) with Lanier PrudeLambert, Junie Avilla T, MD.        Allergies:   Patient has no known allergies.    Social History         Socioeconomic History   Marital status: Single      Spouse name: Not on file   Number of children: Not on file   Years of education: Not on file   Highest education level: Not on file  Occupational History   Not on file  Tobacco Use   Smoking status: Former      Types: Cigarettes      Quit date: 09/24/1987      Years since quitting: 34.7   Smokeless tobacco: Never   Tobacco comments:      Smoked from age 64 until age 51  Substance and Sexual Activity   Alcohol use: No   Drug use: No   Sexual activity: Yes      Partners: Male  Other Topics Concern   Not on file  Social History Narrative   Not on file    Social Determinants of Health    Financial Resource Strain: Not on file  Food Insecurity: Not on file  Transportation Needs: Not on file  Physical Activity: Not on file  Stress: Not on file  Social Connections: Not on file      Family History: The patient's family history includes  Breast cancer in his mother; Cancer in his father and mother; Hyperlipidemia in his father and mother; Lung cancer in his father.   ROS:   Please see the history of present illness.    (+) bilateral LE edema All other systems reviewed and are negative.   EKGs/Labs/Other Studies Reviewed:     The following studies were reviewed today:   Echocardiogram 03/26/2022: IMPRESSIONS     1. Left ventricular ejection fraction by 3D volume is 54 %. The left  ventricle has low normal function. The left ventricle has no regional wall  motion abnormalities. The left ventricular internal cavity size was  moderately dilated. Left ventricular  diastolic parameters are consistent with Grade I diastolic dysfunction  (impaired relaxation). The average left ventricular global longitudinal  strain is -19.6 %. The global longitudinal strain is normal.   2. Right ventricular systolic function is normal. The right ventricular  size is mildly enlarged. There is normal pulmonary artery systolic  pressure. The estimated right ventricular systolic pressure is 23.6 mmHg.   3. Left atrial size was mildly dilated.   4. The mitral valve is normal in structure. Mild mitral valve  regurgitation. No evidence of mitral stenosis.   5. The aortic valve is tricuspid. Aortic valve regurgitation is not  visualized. No aortic stenosis is present.   6. The inferior vena cava is normal in size with greater than 50%  respiratory variability, suggesting right atrial pressure of 3 mmHg.   Comparison(s): Prior images reviewed side by side. The left ventricular function has improved. Prior EF 25%.    MR Cardiac Velocity Flow Map 12/06/2021: IMPRESSION:   1.  Severe LV dilatation with severe systolic dysfunction (EF 22%)   2.  Normal RV size with severe systolic dysfunction (EF 26%)   3. Subepicardial late gadolinium enhancement in mid   inferior/inferolateral wall. This is a nonischemic scar pattern, and   location of  scar is often seen in myocarditis. No elevation of   myocardial T2 values to suggest acute myocarditis. Suspect prior   myocarditis.   4. RV insertion site LGE, which is a nonspecific scar pattern often   seen in setting of elevated pulmonary pressures   5. Moderate mitral regurgitation (regurgitant volume 29cc,   regurgitant fraction 38%)   6.  Small pericardial effusion     EKG:   EKG is personally reviewed.  06/25/2022:  Sinus rhythm 03/26/2022: Sinus bradycardia, 55 bpm (Bensimhon, Bevelyn Buckles, MD)   Recent Labs: 11/28/2021: TSH 3.081 12/07/2021: Magnesium 2.1 12/23/2021: Hemoglobin 17.4; Platelets 226  03/26/2022: ALT 21; B Natriuretic Peptide 29.3; BUN 16; Creatinine, Ser 1.53; Potassium 4.3; Sodium 137    Recent Lipid Panel Labs (Brief)          Component Value Date/Time    CHOL 152 12/02/2021 0406    TRIG 59 12/02/2021 0406    HDL 38 (L) 12/02/2021 0406    CHOLHDL 4.0 12/02/2021 0406    VLDL 12 12/02/2021 0406    LDLCALC 102 (H) 12/02/2021 0406        Physical Exam:     VS:  BP 139/86   Pulse 58   Ht 5\' 11"  (1.803 m)   Wt 228 lb 6.4 oz (103.6 kg)   SpO2 96%   BMI 31.86 kg/m         Wt Readings from Last 3 Encounters:  06/25/22 228 lb 6.4 oz (103.6 kg)  03/26/22 214 lb (97.1 kg)  02/03/22 206 lb (93.4 kg)      GEN: Well nourished, well developed in no acute distress HEENT: Normal NECK: No JVD; No carotid bruits LYMPHATICS: No lymphadenopathy CARDIAC: RRR, no murmurs, rubs, gallops RESPIRATORY:  Clear to auscultation without rales, wheezing or rhonchi  ABDOMEN: Soft, non-tender, non-distended MUSCULOSKELETAL:  No edema; No deformity  SKIN: Warm and dry NEUROLOGIC:  Alert and oriented x 3 PSYCHIATRIC:  Normal affect        Assessment ASSESSMENT:     1. Atrial fibrillation with RVR (HCC)   2. Paroxysmal atrial fibrillation (HCC)     PLAN:     #Persistent AF Rhythm control indicated given tachy-mediated cardiomyopathy history. Continue  eliquis.   Discussed treatment options today for their AF including antiarrhythmic drug therapy and ablation. Discussed risks, recovery and likelihood of success. Discussed potential need for repeat ablation procedures and antiarrhythmic drugs after an initial ablation. They wish to proceed with scheduling.   Risk, benefits, and alternatives to EP study and radiofrequency ablation for afib were also discussed in detail today. These risks include but are not limited to stroke, bleeding, vascular damage, tamponade, perforation, damage to the esophagus, lungs, and other structures, pulmonary vein stenosis, worsening renal function, and death. The patient understands these risk and wishes to proceed.  We will therefore proceed with catheter ablation at the next available time.  Carto, ICE, anesthesia are requested for the procedure.  Will also obtain CT PV protocol prior to the procedure to exclude LAA thrombus and further evaluate atrial anatomy.    Plan for PVI today.    Signed, Rossie Muskrat. Lalla Brothers, MD, [facility], [facility] 09/22/2022 Electrophysiology [facility]

## 2022-09-22 NOTE — Transfer of Care (Signed)
Immediate Anesthesia Transfer of Care Note  Patient: Adam Henson  Procedure(s) Performed: ATRIAL FIBRILLATION ABLATION  Patient Location: PACU  Anesthesia Type:General  Level of Consciousness: awake and drowsy  Airway & Oxygen Therapy: Patient Spontanous Breathing and Patient connected to nasal cannula oxygen  Post-op Assessment: Report given to RN, Post -op Vital signs reviewed and stable, and Patient moving all extremities  Post vital signs: Reviewed and stable  Last Vitals:  Vitals Value Taken Time  BP 131/84 09/22/22 1145  Temp 36.4 C 09/22/22 1140  Pulse 58 09/22/22 1145  Resp 16 09/22/22 1145  SpO2 98 % 09/22/22 1145  Vitals shown include unvalidated device data.  Last Pain:  Vitals:   09/22/22 1140  TempSrc: Temporal  PainSc:          Complications: No notable events documented.

## 2022-09-22 NOTE — Anesthesia Preprocedure Evaluation (Addendum)
Anesthesia Evaluation  Patient identified by MRN, date of birth, ID band Patient awake    Reviewed: Allergy & Precautions, H&P , NPO status , Patient's Chart, lab work & pertinent test results  Airway Mallampati: II  TM Distance: >3 FB Neck ROM: Full    Dental no notable dental hx. (+) Teeth Intact, Dental Advisory Given   Pulmonary former smoker   Pulmonary exam normal breath sounds clear to auscultation       Cardiovascular + dysrhythmias Atrial Fibrillation  Rhythm:Regular Rate:Normal     Neuro/Psych negative neurological ROS  negative psych ROS   GI/Hepatic negative GI ROS, Neg liver ROS,,,  Endo/Other  negative endocrine ROS    Renal/GU negative Renal ROS  negative genitourinary   Musculoskeletal   Abdominal   Peds  Hematology negative hematology ROS (+)   Anesthesia Other Findings   Reproductive/Obstetrics negative OB ROS                             Anesthesia Physical Anesthesia Plan  ASA: 3  Anesthesia Plan: General   Post-op Pain Management: Tylenol PO (pre-op)*   Induction: Intravenous  PONV Risk Score and Plan: 3 and Ondansetron, Dexamethasone and Midazolam  Airway Management Planned: Oral ETT  Additional Equipment:   Intra-op Plan:   Post-operative Plan: Extubation in OR  Informed Consent: I have reviewed the patients History and Physical, chart, labs and discussed the procedure including the risks, benefits and alternatives for the proposed anesthesia with the patient or authorized representative who has indicated his/her understanding and acceptance.     Dental advisory given  Plan Discussed with: CRNA  Anesthesia Plan Comments:        Anesthesia Quick Evaluation

## 2022-09-23 ENCOUNTER — Encounter (HOSPITAL_COMMUNITY): Payer: Self-pay | Admitting: Cardiology

## 2022-10-08 ENCOUNTER — Other Ambulatory Visit: Payer: Self-pay | Admitting: Student

## 2022-10-08 ENCOUNTER — Other Ambulatory Visit (HOSPITAL_COMMUNITY): Payer: Self-pay | Admitting: Family Medicine

## 2022-10-27 ENCOUNTER — Ambulatory Visit (HOSPITAL_COMMUNITY)
Admission: RE | Admit: 2022-10-27 | Discharge: 2022-10-27 | Disposition: A | Discharge status: Home / Self Care | Payer: No Typology Code available for payment source | Source: Ambulatory Visit | Attending: Physician Assistant | Admitting: Physician Assistant

## 2022-10-27 VITALS — BP 118/76 | HR 57 | Ht 71.0 in | Wt 236.0 lb

## 2022-10-27 DIAGNOSIS — I130 Hypertensive heart and chronic kidney disease with heart failure and stage 1 through stage 4 chronic kidney disease, or unspecified chronic kidney disease: Secondary | ICD-10-CM | POA: Insufficient documentation

## 2022-10-27 DIAGNOSIS — Z8349 Family history of other endocrine, nutritional and metabolic diseases: Secondary | ICD-10-CM | POA: Insufficient documentation

## 2022-10-27 DIAGNOSIS — Z5181 Encounter for therapeutic drug level monitoring: Secondary | ICD-10-CM

## 2022-10-27 DIAGNOSIS — D6869 Other thrombophilia: Secondary | ICD-10-CM

## 2022-10-27 DIAGNOSIS — Z79899 Other long term (current) drug therapy: Secondary | ICD-10-CM

## 2022-10-27 DIAGNOSIS — Z7901 Long term (current) use of anticoagulants: Secondary | ICD-10-CM | POA: Diagnosis not present

## 2022-10-27 DIAGNOSIS — N189 Chronic kidney disease, unspecified: Secondary | ICD-10-CM | POA: Diagnosis not present

## 2022-10-27 DIAGNOSIS — E785 Hyperlipidemia, unspecified: Secondary | ICD-10-CM | POA: Insufficient documentation

## 2022-10-27 DIAGNOSIS — I5022 Chronic systolic (congestive) heart failure: Secondary | ICD-10-CM | POA: Insufficient documentation

## 2022-10-27 DIAGNOSIS — Z87891 Personal history of nicotine dependence: Secondary | ICD-10-CM | POA: Insufficient documentation

## 2022-10-27 DIAGNOSIS — I4819 Other persistent atrial fibrillation: Secondary | ICD-10-CM | POA: Diagnosis not present

## 2022-10-27 NOTE — Progress Notes (Signed)
Primary Care Physician: Pcp, No Primary Cardiologist: Dr Gala Romney  Primary Electrophysiologist: Dr Lalla Brothers Referring Physician: Dr Lalla Brothers   Adam Henson is a 62 y.o. male with a history of systolic CHF, HLD, CKD, HTN, atrial fibrillation who presents for follow up in the [facility]. Patient was admitted 12/07/22 with new acute systolic heart failure. He was found to be in atrial fibrillation with RVR rates in 160s-170s. Diuresed with IV lasix and started on Dilt gtt, later switched to metoprolol for rate control. Planned for TEE/DCCV, TEE EF < 20%, moderate MR, large LAA thrombus; no DCCV. Required amiodarone gtt and DBA for BP support. RHC/LHC showed normal coronaries. 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 Patient is s/p afib ablation with Dr Lalla Brothers on 09/22/22. Patient is on Eliquis for a CHADS2VASC score of 4.  On follow up today, patient reports that he has done well since the ablation. He has not had any known episodes of afib. He denies chest pain, swallowing pain, or groin issues. No bleeding issues on anticoagulation.   Today, he denies symptoms of palpitations, chest pain, shortness of breath, orthopnea, PND, lower extremity edema, dizziness, presyncope, syncope, snoring, daytime somnolence, bleeding, or neurologic sequela. The patient is tolerating medications without difficulties and is otherwise without complaint today.    Atrial Fibrillation Risk Factors:  he does have symptoms or diagnosis of sleep apnea. he has Itamar sleep test at home he does not have a history of rheumatic fever.   he has a BMI of Body mass index is 32.92 kg/m.Marland Kitchen Filed Weights   10/27/22 1425  Weight: 107 kg    Family History  Problem Relation Age of Onset   Hyperlipidemia Mother    Breast cancer Mother    Cancer Mother    Hyperlipidemia Father    Lung cancer Father        deceased   Cancer Father      Atrial Fibrillation Management  history:  Previous antiarrhythmic drugs: amiodarone  Previous cardioversions: none Previous ablations: 09/22/22 Anticoagulation history: Eliquis   Past Medical History:  Diagnosis Date   Hyperlipidemia    Past Surgical History:  Procedure Laterality Date   ATRIAL FIBRILLATION ABLATION N/A 09/22/2022   Procedure: ATRIAL FIBRILLATION ABLATION;  Surgeon: Lanier Prude, MD;  Location: [facility];  Service: Cardiovascular;  Laterality: N/A;   JOINT REPLACEMENT     MANDIBLE FRACTURE SURGERY     RIGHT/LEFT HEART CATH AND CORONARY ANGIOGRAPHY N/A 12/05/2021   Procedure: RIGHT/LEFT HEART CATH AND CORONARY ANGIOGRAPHY;  Surgeon: Dolores Patty, MD;  Location: [facility]AS[facility] CV LAB;  Service: Cardiovascular;  Laterality: N/A;   TEE WITHOUT CARDIOVERSION N/A 12/03/2021   Procedure: TRANSESOPHAGEAL ECHOCARDIOGRAM (TEE);  Surgeon: Vesta Mixer, MD;  Location: [facility] ENDOSCOPY;  Service: Cardiovascular;  Laterality: N/A;    Current Outpatient Medications  Medication Sig Dispense Refill   amiodarone (PACERONE) 200 MG tablet Take 1 tablet (200 mg total) by mouth daily. 90 tablet 3   apixaban (ELIQUIS) 5 MG TABS tablet Take 1 tablet (5 mg total) by mouth 2 (two) times daily. 180 tablet 3   Brimonidine Tartrate (LUMIFY) 0.025 % SOLN Place 1 drop into both eyes daily as needed (redness).     carvedilol (COREG) 3.125 MG tablet TAKE 1 TABLET BY MOUTH TWICE A DAY 180 tablet 1   dapagliflozin propanediol (FARXIGA) 10 MG TABS tablet Take 1 tablet (10 mg total) by mouth daily before breakfast. 90 tablet 3  digoxin (LANOXIN) 0.125 MG tablet Take 125 mcg by mouth daily.     furosemide (LASIX) 40 MG tablet TAKE 1 TABLET (40 MG TOTAL) BY MOUTH DAILY AS NEEDED. TAKE AS DIRECTED AT DISCHARGE 90 tablet 3   sacubitril-valsartan (ENTRESTO) 97-103 MG Take 1 tablet by mouth 2 (two) times daily. 90 tablet 3   No current facility-administered medications for this encounter.    No Known Allergies  Social  History   Socioeconomic History   Marital status: Single    Spouse name: Not on file   Number of children: Not on file   Years of education: Not on file   Highest education level: Not on file  Occupational History   Not on file  Tobacco Use   Smoking status: Former    Types: Cigarettes    Quit date: 09/24/1987    Years since quitting: 35.1   Smokeless tobacco: Never   Tobacco comments:    Smoked from age 61 until age 29  Substance and Sexual Activity   Alcohol use: No   Drug use: No   Sexual activity: Yes    Partners: Male  Other Topics Concern   Not on file  Social History Narrative   Not on file   Social Determinants of Health   Financial Resource Strain: Not on file  Food Insecurity: Not on file  Transportation Needs: Not on file  Physical Activity: Not on file  Stress: Not on file  Social Connections: Not on file  Intimate Partner Violence: Not on file     ROS- All systems are reviewed and negative except as per the HPI above.  Physical Exam: Vitals:   10/27/22 1425  BP: 118/76  Pulse: (!) 57  Weight: 107 kg  Height: 5\' 11"  (1.803 m)    GEN- The patient is a well appearing male, alert and oriented x 3 today.   Head- normocephalic, atraumatic Eyes-  Sclera clear, conjunctiva pink Ears- hearing intact Oropharynx- clear Neck- supple  Lungs- Clear to ausculation bilaterally, normal work of breathing Heart- Regular rate and rhythm, no murmurs, rubs or gallops  GI- soft, NT, ND, + BS Extremities- no clubbing, cyanosis, or edema MS- no significant deformity or atrophy Skin- no rash or lesion Psych- euthymic mood, full affect Neuro- strength and sensation are intact  Wt Readings from Last 3 Encounters:  10/27/22 107 kg  09/22/22 102.5 kg  07/28/22 103 kg    EKG today demonstrates  SB, 1st degree AV block Vent. rate 57 BPM PR interval 202 ms QRS duration 98 ms QT/QTcB 420/408 ms  Echo 03/26/22 demonstrated  1. Left ventricular ejection  fraction by 3D volume is 54 %. The left  ventricle has low normal function. The left ventricle has no regional wall  motion abnormalities. The left ventricular internal cavity size was  moderately dilated. Left ventricular diastolic parameters are consistent with Grade I diastolic dysfunction (impaired relaxation). The average left ventricular global longitudinal strain is -19.6 %. The global longitudinal strain is normal.   2. Right ventricular systolic function is normal. The right ventricular  size is mildly enlarged. There is normal pulmonary artery systolic  pressure. The estimated right ventricular systolic pressure is 23.6 mmHg.   3. Left atrial size was mildly dilated.   4. The mitral valve is normal in structure. Mild mitral valve  regurgitation. No evidence of mitral stenosis.   5. The aortic valve is tricuspid. Aortic valve regurgitation is not  visualized. No aortic stenosis is present.  6. The inferior vena cava is normal in size with greater than 50%  respiratory variability, suggesting right atrial pressure of 3 mmHg.   Comparison(s): Prior images reviewed side by side. The left ventricular  function has improved. Prior EF 25%.   Epic records are reviewed at length today.  CHA2DS2-VASc Score = 4  The patient's score is based upon: CHF History: 1 HTN History: 1 Diabetes History: 0 Stroke History: 2 (h/o LAA thrombus) Vascular Disease History: 0 Age Score: 0 Gender Score: 0       ASSESSMENT AND PLAN: 1. Persistent Atrial Fibrillation (ICD10:  I48.19) The patient's CHA2DS2-VASc score is 4, indicating a 4.8% annual risk of stroke.   S/p afib ablation 09/22/22 Patient appears to be maintaining SR.  Continue Eliquis 5 mg BID with no missed doses for ate least 3 months post ablation.  Continue amiodarone 200 mg daily for now, hopefully will be able to discontinue this.  Continue carvedilol 3.125 mg BID Continue digoxin 0.125 mg daily for now.   2. Secondary  Hypercoagulable State (ICD10:  D68.69) The patient is at significant risk for stroke/thromboembolism based upon his CHA2DS2-VASc Score of 4.  Continue Apixaban (Eliquis).   3. Chronic systolic CHF EF 25-30% initially, suspected tachycardia mediated. EF improved to 55% with SR. GDMT per [facility] Fluid status appears stable today.  4. Snoring/daytime somnolence  Sleep study ordered 03/2022 Encouraged patient to do Itamar study, he has it at home.   5. HTN Stable, no changes today.   Follow up with Dr Lalla Brothers as scheduled.    Jorja Loa PA-C Afib Clinic [facility] [address] [place], [place] [address] [phone] 10/27/2022 3:05 PM

## 2022-12-12 IMAGING — US US ABDOMEN LIMITED
1 series · 14 of 25 positions shown · non-contrast
Comparison: None Available.

CLINICAL DATA: 60-year-old male with abnormal LFTs.

EXAM:
ULTRASOUND ABDOMEN LIMITED RIGHT UPPER QUADRANT

[Series 1: us abdomen limited ruq (liver/gb) · 14 of 58 slices shown]
[im 1/58]
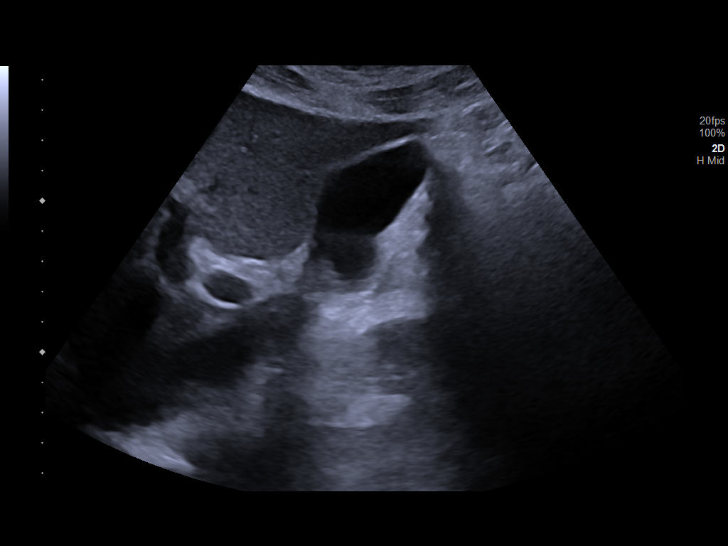
[im 5/58]
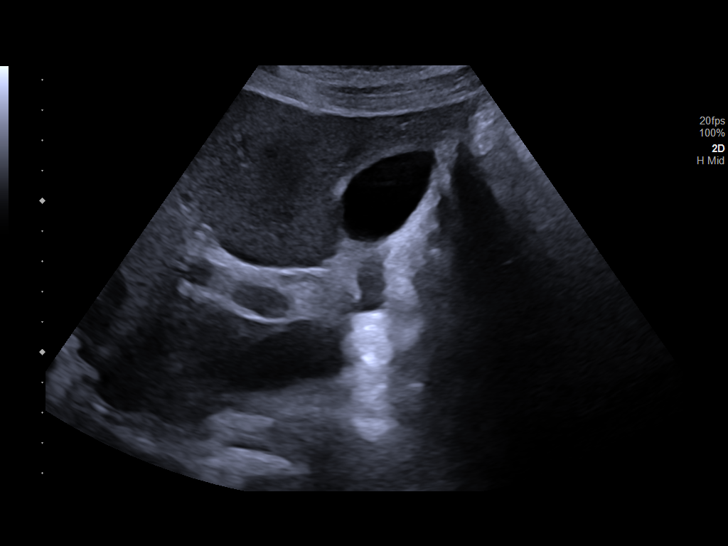
[im 10/58]
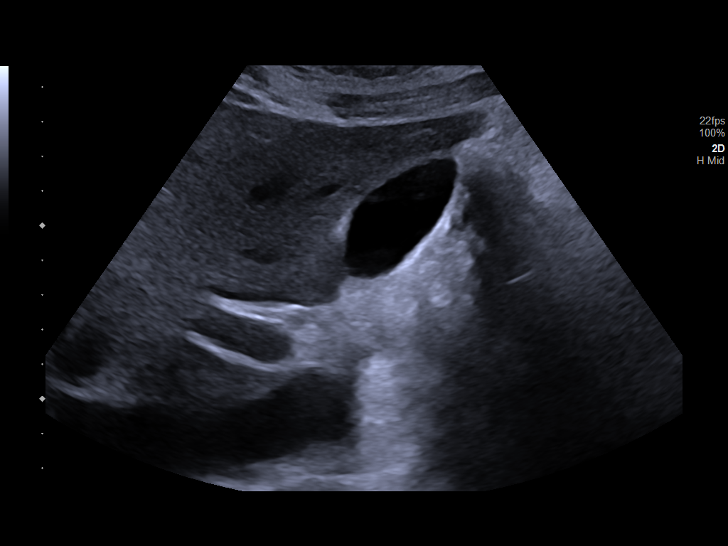
[im 15/58]
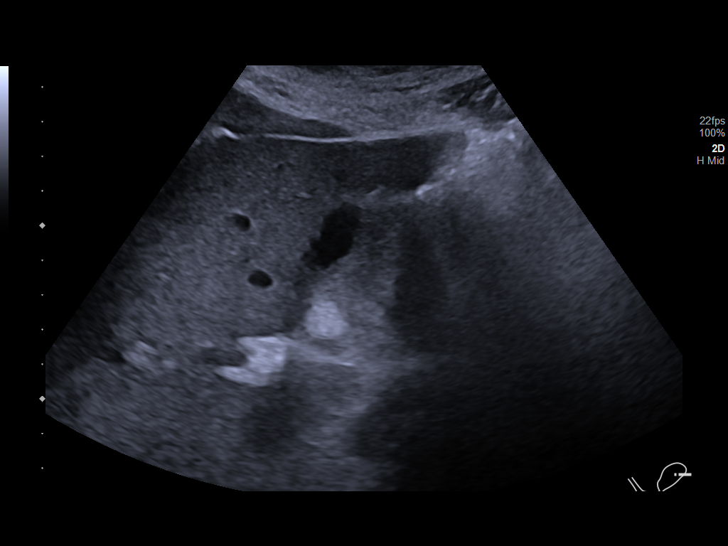
[im 20/58]
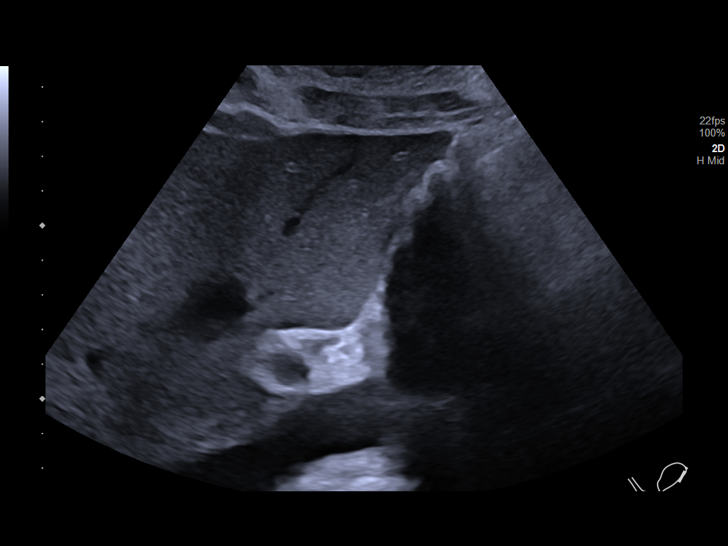
[im 22/58]
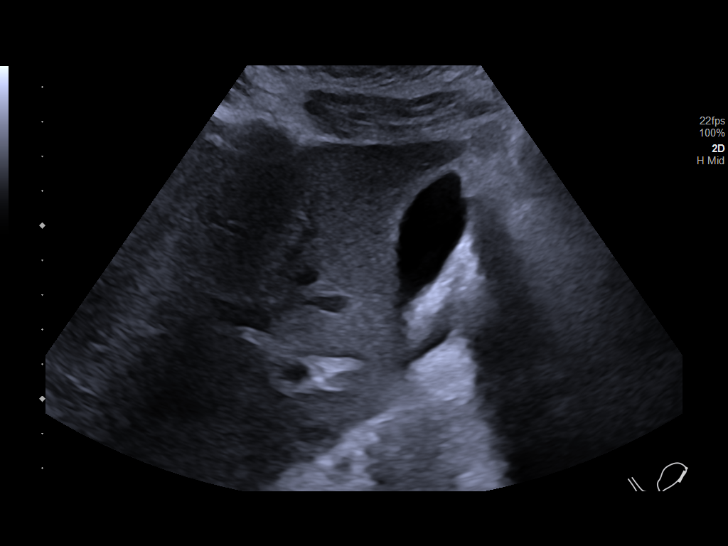
[im 27/58]
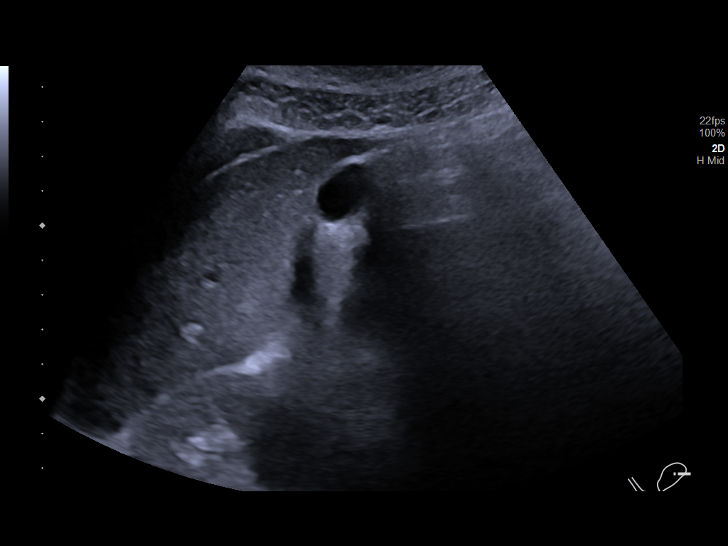
[im 31/58]
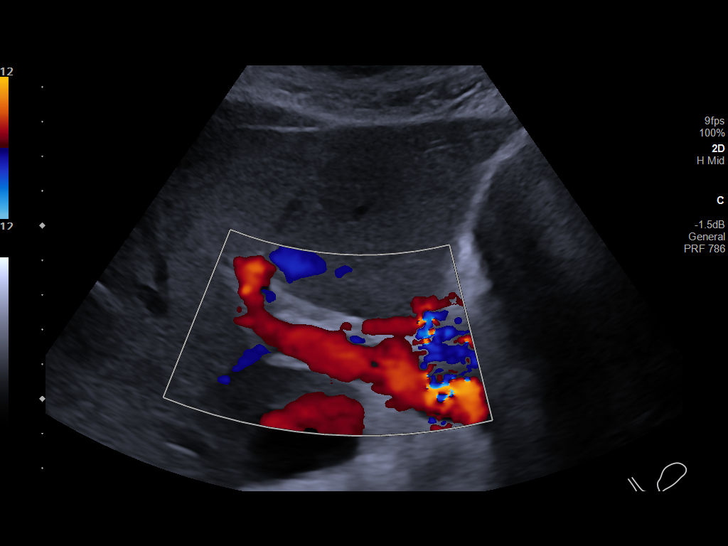
[im 36/58]
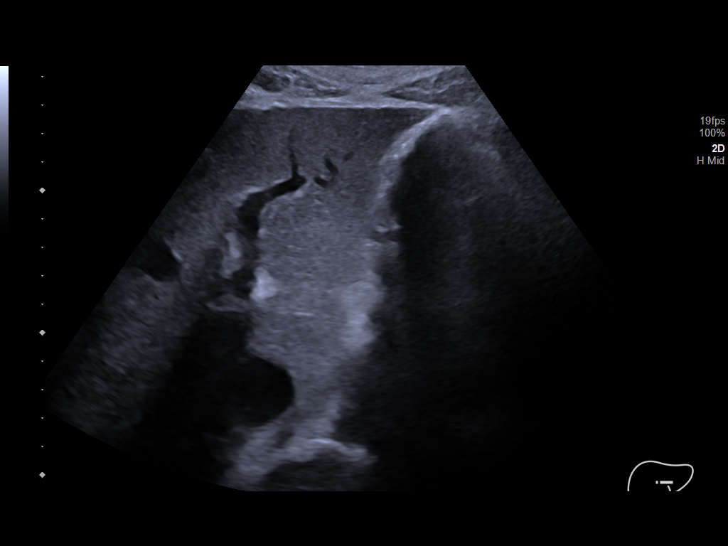
[im 39/58]
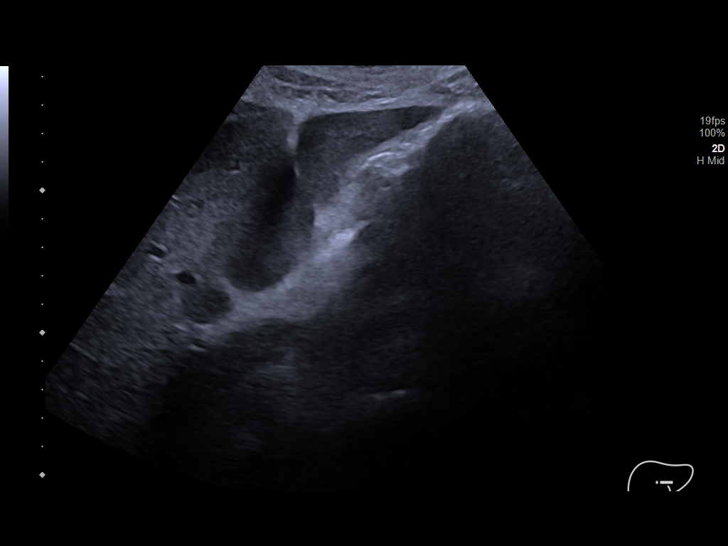
[im 43/58]
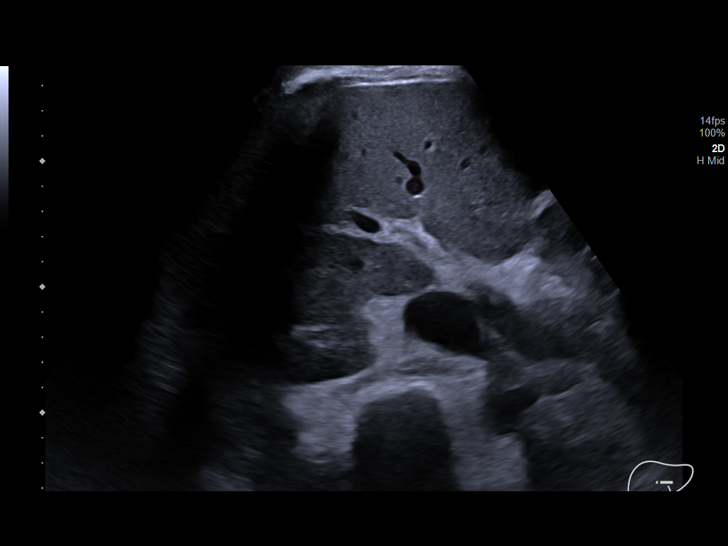
[im 48/58]
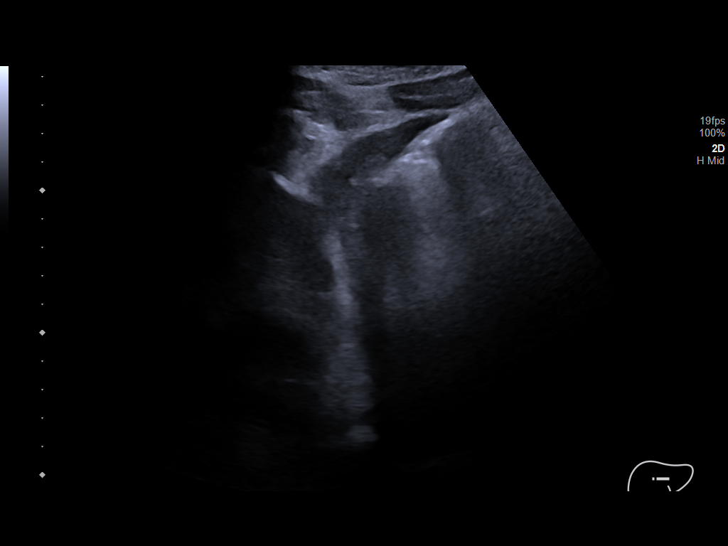
[im 53/58]
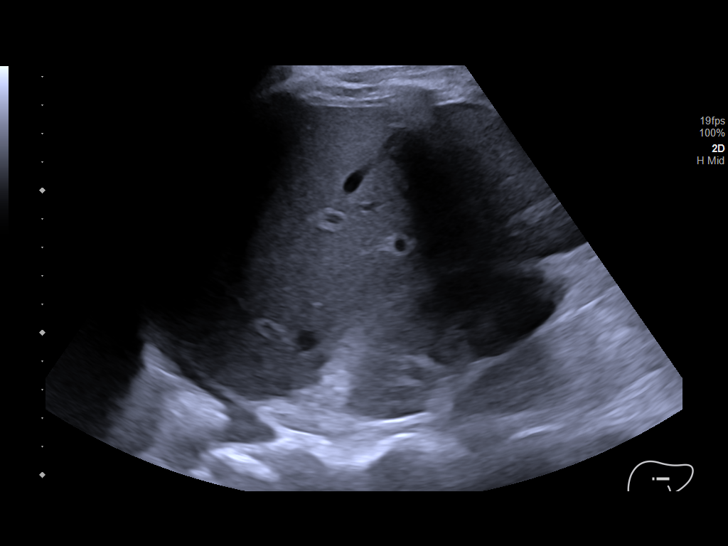
[im 58/58]
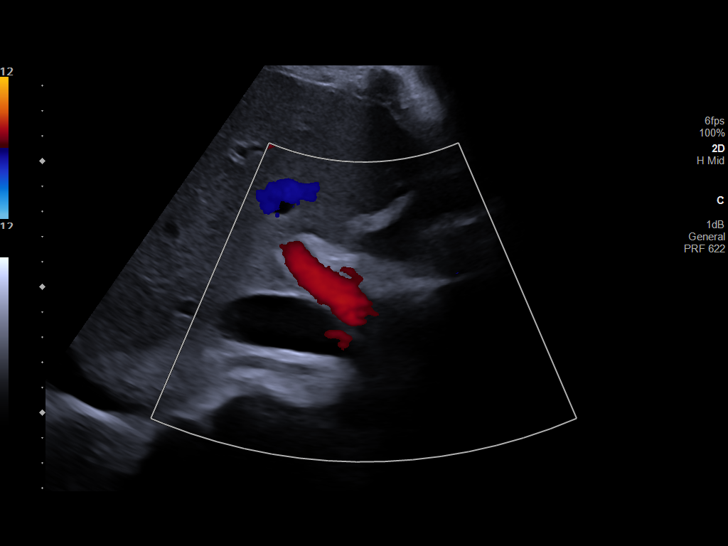

[14 of 25 positions shown; findings below may reference images not displayed]

FINDINGS: Gallbladder:

No gallstones or wall thickening visualized. No sonographic Murphy
sign noted by sonographer.

Common bile duct:

Diameter: 3 mm, normal.

Liver:

No focal lesion identified. Within normal limits in parenchymal
echogenicity. Portal vein is patent on color Doppler imaging with
normal direction of blood flow towards the liver.

Other: But there is a right pleural effusion visible (image 42), and
evidence of trace perihepatic ascites (image 56). Negative visible
right kidney.
IMPRESSION: 1. Negative ultrasound appearance of the liver and gallbladder.
2. But trace perihepatic ascites, and a right pleural effusion is
visible.

## 2022-12-17 IMAGING — US US EXTREM UP *R* LTD
1 series · 13 of 13 positions shown · non-contrast
Comparison: None Available.

CLINICAL DATA: Rule out abscess.

EXAM:
ULTRASOUND RIGHT UPPER EXTREMITY LIMITED
TECHNIQUE: Ultrasound examination of the upper extremity soft tissues was
performed in the area of clinical concern.

[Series 1: us soft tissue right upper extremity limited (non- · 13 of 13 slices shown]
[im 1/13]
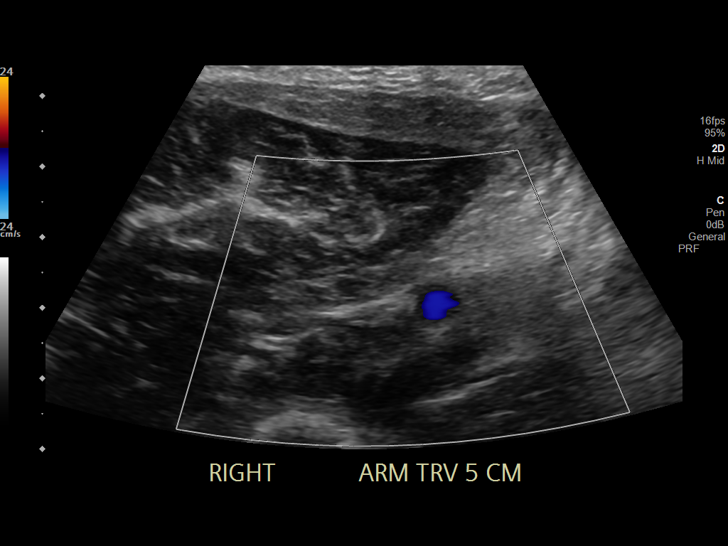
[im 2/13]
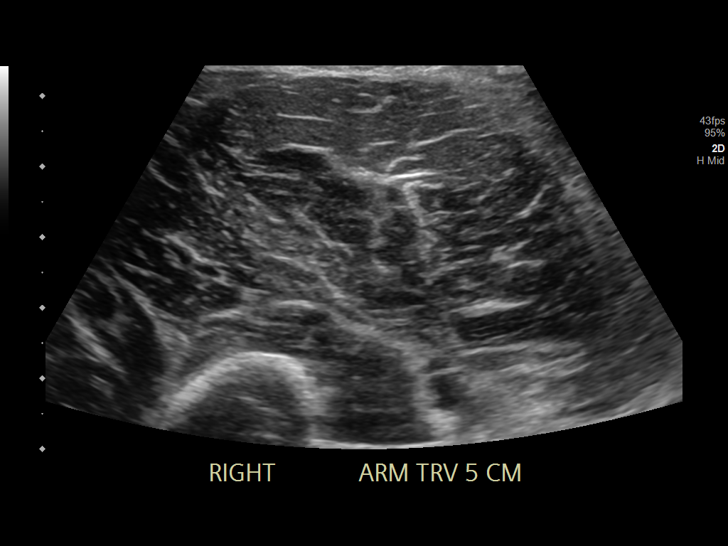
[im 3/13]
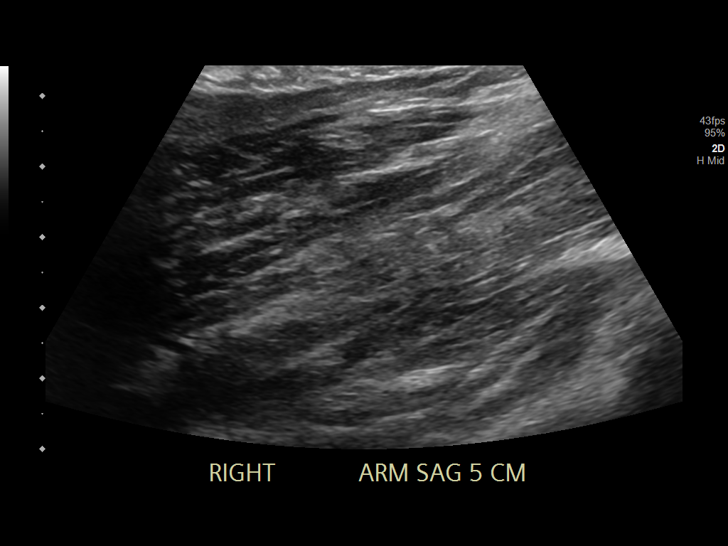
[im 4/13]
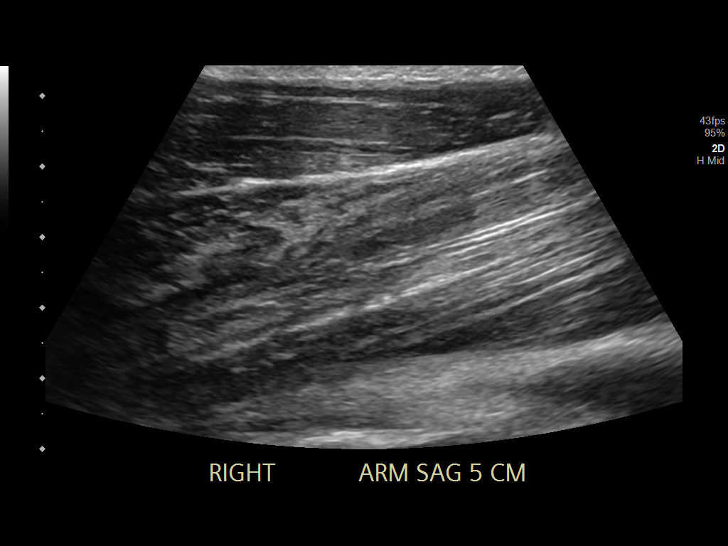
[im 5/13]
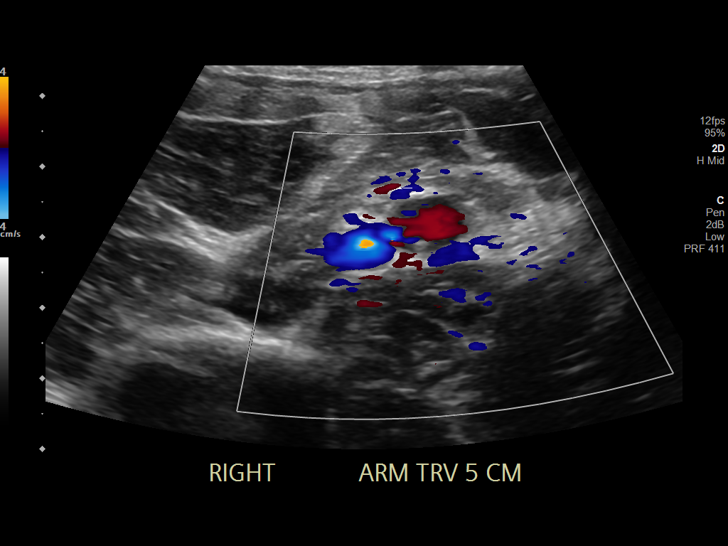
[im 6/13]
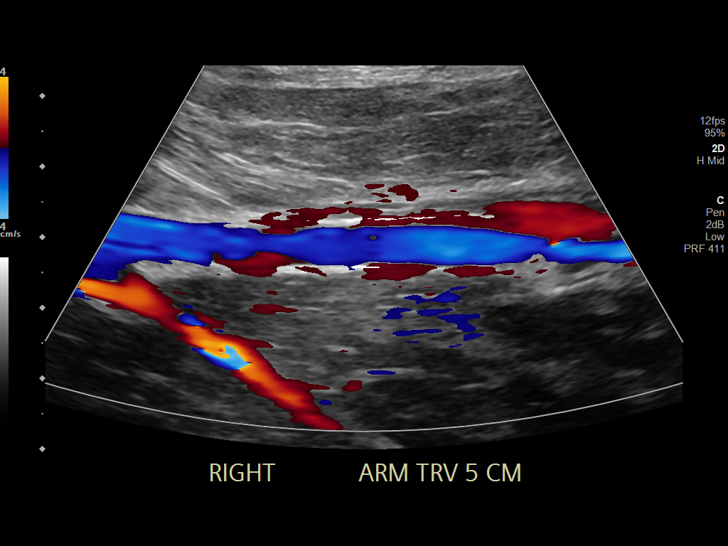
[im 7/13]
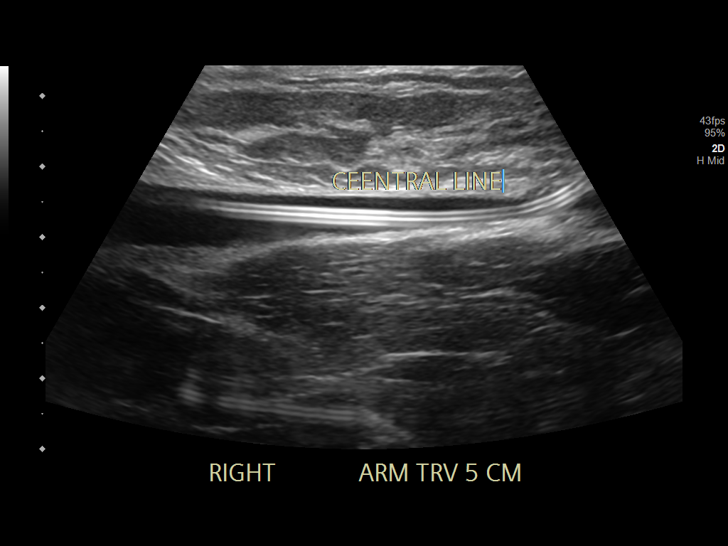
[im 8/13]
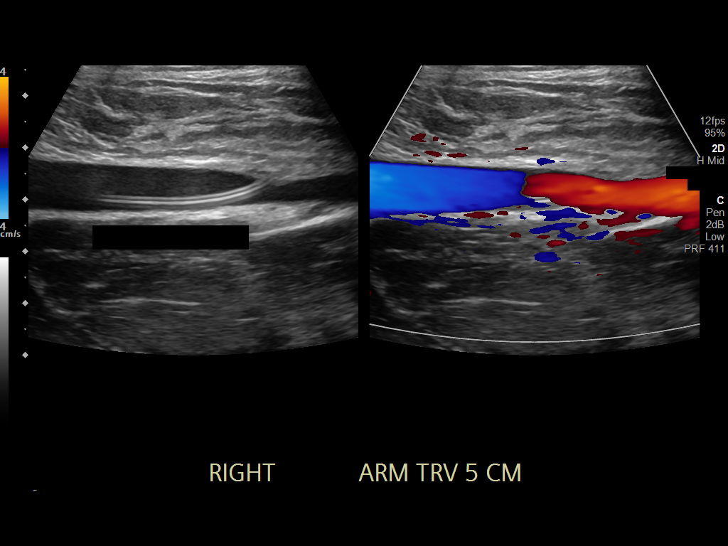
[im 9/13]
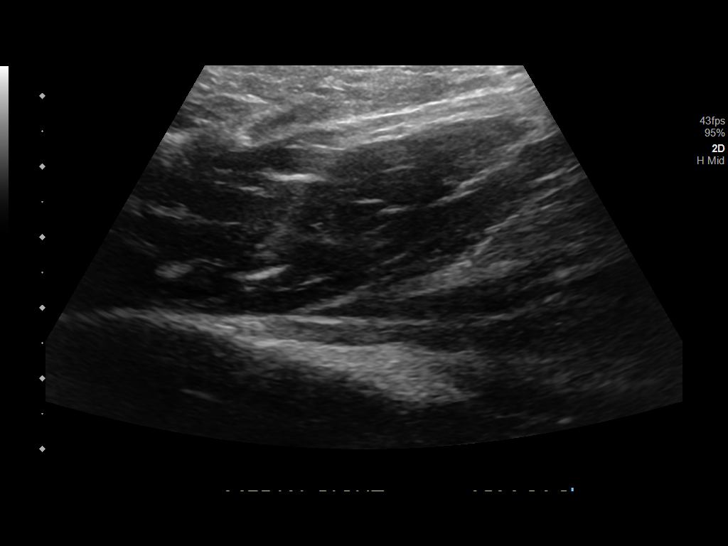
[im 10/13]
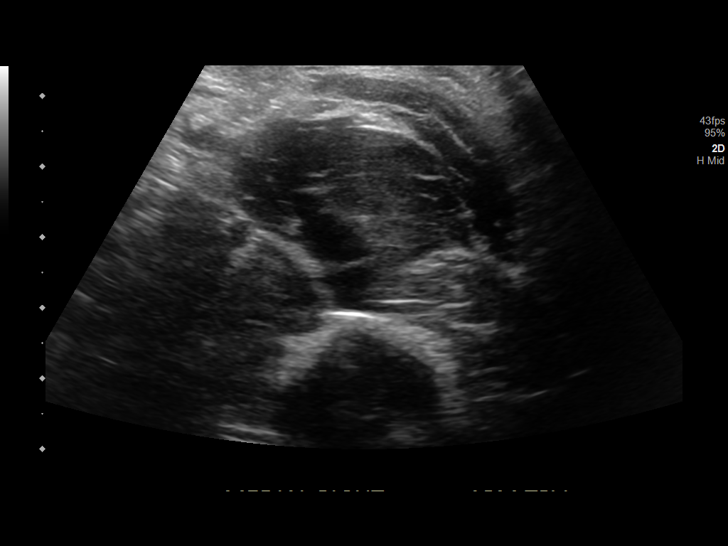
[im 11/13]
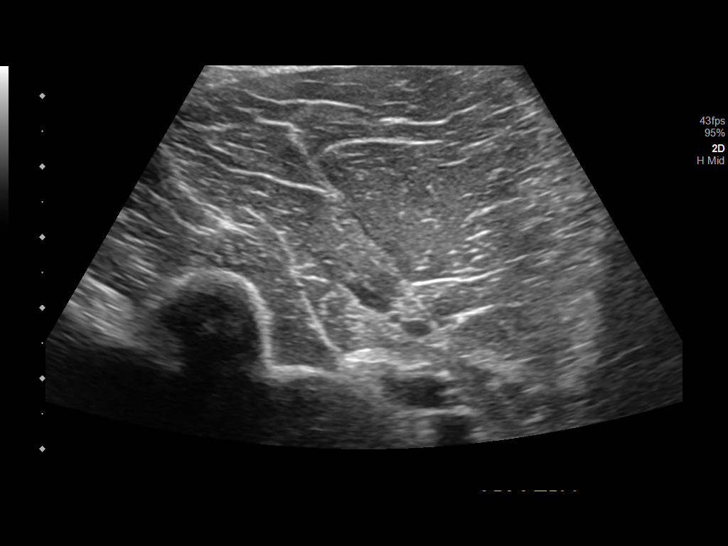
[im 12/13]
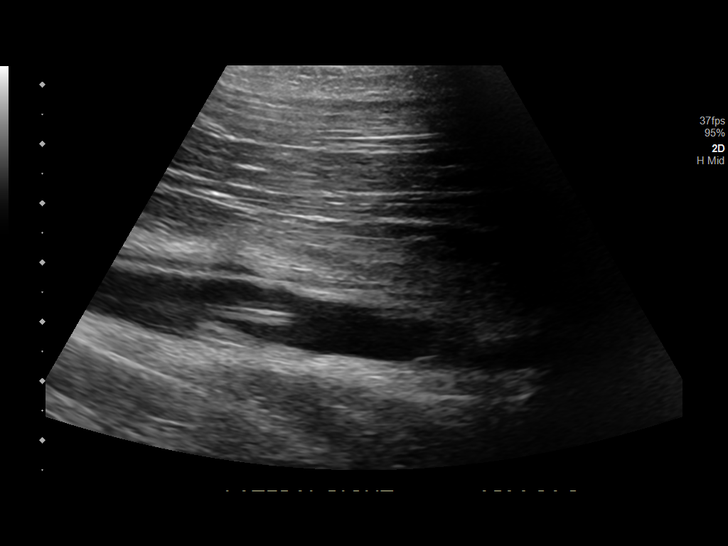
[im 13/13]
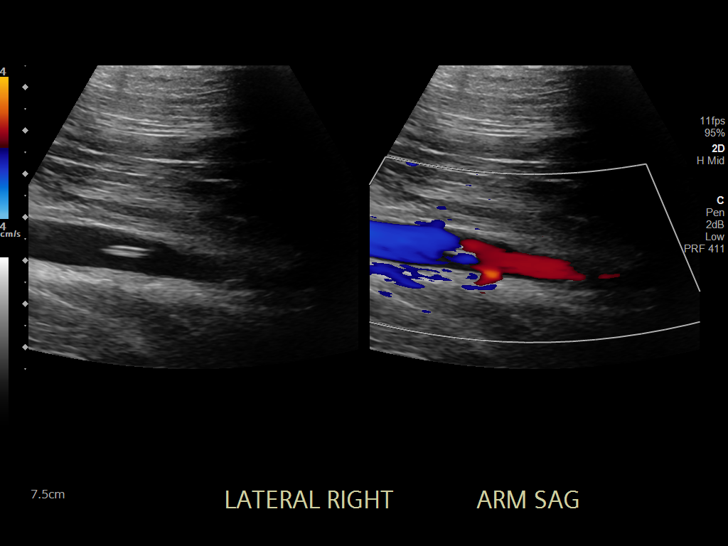

[13 of 13 positions shown; findings below may reference images not displayed]

FINDINGS: Examination is limited due to overlying bandages. Scanning was
performed around bandages in the right upper arm. No focal fluid
collection is seen in the region of concern. A central catheter is
noted in the right upper extremity 5 cm below armpit.
IMPRESSION: Limited evaluation due to overlying bandages. No focal fluid
collection or abscess is seen.

## 2023-01-15 ENCOUNTER — Other Ambulatory Visit (HOSPITAL_COMMUNITY): Payer: Self-pay | Admitting: Family Medicine

## 2023-01-15 ENCOUNTER — Ambulatory Visit: Payer: Medicaid Other | Attending: Cardiology | Admitting: Cardiology

## 2023-01-15 ENCOUNTER — Encounter: Payer: Self-pay | Admitting: Cardiology

## 2023-01-15 VITALS — BP 118/74 | HR 58 | Ht 71.0 in | Wt 235.6 lb

## 2023-01-15 DIAGNOSIS — Z79899 Other long term (current) drug therapy: Secondary | ICD-10-CM

## 2023-01-15 DIAGNOSIS — I5022 Chronic systolic (congestive) heart failure: Secondary | ICD-10-CM | POA: Diagnosis not present

## 2023-01-15 DIAGNOSIS — Z5181 Encounter for therapeutic drug level monitoring: Secondary | ICD-10-CM | POA: Diagnosis not present

## 2023-01-15 DIAGNOSIS — I4819 Other persistent atrial fibrillation: Secondary | ICD-10-CM | POA: Diagnosis not present

## 2023-01-15 NOTE — Patient Instructions (Signed)
Try Pepcid for acid reflux  Medication Instructions:  Your physician has recommended you make the following change in your medication:  1) STOP taking amiodarone *If you need a refill on your cardiac medications before your next appointment, please call your pharmacy*  Follow-Up: At [facility], you and your health needs are our priority.  As part of our continuing mission to provide you with exceptional heart care, we have created designated Provider Care Teams.  These Care Teams include your primary Cardiologist (physician) and Advanced Practice Providers (APPs -  Physician Assistants and Nurse Practitioners) who all work together to provide you with the care you need, when you need it.  Your next appointment:   6 month(s)  Provider:   You will see one of the following Advanced Practice Providers on your designated Care Team:   Francis Dowse, Charlott Holler [address] [place], [place] Sherie Don, NP Canary Brim, NP

## 2023-01-15 NOTE — Progress Notes (Signed)
  Electrophysiology Office Follow up Visit Note:    Date:  01/15/2023   ID:  Adam Henson, DOB [birth year], MRN [id number]  PCP:  Pcp, No  [facility] Cardiologist:  None  [facility] Electrophysiologist:  Lanier Prude, MD    Interval History:    Adam Henson is a 62 y.o. male who presents for a follow up visit.   He had an A-fib ablation on September 22, 2022 during which the pulmonary veins and posterior wall were isolated.  He is all [place] on Oct 27, 2022 and reported no recurrent episodes of atrial fibrillation following the ablation procedure.  He takes Eliquis for stroke prophylaxis in addition to amiodarone and Coreg. Today he is doing well.  He has some acid reflux sensations in his epigastrium following the ablation.  He has not returned to atrial fibrillation.  He continues to take his Eliquis and amiodarone.      Past medical, surgical, social and family history were reviewed.  ROS:   Please see the history of present illness.    All other systems reviewed and are negative.  EKGs/Labs/Other Studies Reviewed:    The following studies were reviewed today:    EKG Interpretation Date/Time:  Thursday January 15 2023 15:17:29 EDT Ventricular Rate:  58 PR Interval:  192 QRS Duration:  108 QT Interval:  426 QTC Calculation: 418 R Axis:   66  Text Interpretation: Sinus bradycardia Confirmed by Steffanie Dunn [phone]) on 01/15/2023 3:41:27 PM    Physical Exam:    VS:  BP 118/74   Pulse (!) 58   Ht 5\' 11"  (1.803 m)   Wt 235 lb 9.6 oz (106.9 kg)   SpO2 99%   BMI 32.86 kg/m     Wt Readings from Last 3 Encounters:  01/15/23 235 lb 9.6 oz (106.9 kg)  10/27/22 236 lb (107 kg)  09/22/22 226 lb (102.5 kg)     GEN:  Well nourished, well developed in no acute distress CARDIAC: RRR, no murmurs, rubs, gallops RESPIRATORY:  Clear to auscultation without rales, wheezing or rhonchi       ASSESSMENT:    1. Persistent atrial fibrillation (HCC)   2. Encounter  for monitoring amiodarone therapy   3. Chronic systolic heart failure (HCC)    PLAN:    In order of problems listed above:   #Persistent atrial fibrillation #High risk drug monitoring-amiodarone Doing well after catheter ablation. Continue Eliquis Stop amiodarone.  If recurrence, favor repeat catheter ablation given his young age and overall state health.  #Chronic systolic heart failure NYHA class II.  Warm and dry on exam.  Continue Entresto, Lasix, digoxin, Farxiga, Coreg  #GERD Pepcid trial.  He will pick this up over-the-counter.  Follow-up 6 months with APP.    Signed, Steffanie Dunn, MD, [facility] 01/15/2023 3:48 PM    Electrophysiology [facility]

## 2023-02-23 ENCOUNTER — Other Ambulatory Visit (HOSPITAL_COMMUNITY): Payer: Self-pay | Admitting: Internal Medicine

## 2023-02-28 ENCOUNTER — Other Ambulatory Visit (HOSPITAL_COMMUNITY): Payer: Self-pay | Admitting: Internal Medicine

## 2023-03-04 ENCOUNTER — Other Ambulatory Visit (HOSPITAL_COMMUNITY): Payer: Self-pay | Admitting: Internal Medicine

## 2023-04-08 ENCOUNTER — Telehealth (HOSPITAL_COMMUNITY): Payer: Self-pay | Admitting: Cardiology

## 2023-04-08 NOTE — Telephone Encounter (Signed)
Pt left VM on triage line requesting appt Will route to HF schedulers to assist

## 2023-04-14 ENCOUNTER — Encounter (HOSPITAL_COMMUNITY): Payer: No Typology Code available for payment source | Admitting: Internal Medicine

## 2023-04-29 ENCOUNTER — Encounter (HOSPITAL_COMMUNITY): Payer: Self-pay

## 2023-04-29 ENCOUNTER — Ambulatory Visit (HOSPITAL_COMMUNITY)
Admission: RE | Admit: 2023-04-29 | Discharge: 2023-04-29 | Disposition: A | Discharge status: Home / Self Care | Payer: No Typology Code available for payment source | Source: Ambulatory Visit | Attending: Family Medicine | Admitting: Family Medicine

## 2023-04-29 VITALS — BP 130/88 | HR 74 | Wt 247.8 lb

## 2023-04-29 DIAGNOSIS — I480 Paroxysmal atrial fibrillation: Secondary | ICD-10-CM | POA: Diagnosis present

## 2023-04-29 DIAGNOSIS — Z7901 Long term (current) use of anticoagulants: Secondary | ICD-10-CM | POA: Diagnosis not present

## 2023-04-29 DIAGNOSIS — I4819 Other persistent atrial fibrillation: Secondary | ICD-10-CM | POA: Diagnosis not present

## 2023-04-29 DIAGNOSIS — I130 Hypertensive heart and chronic kidney disease with heart failure and stage 1 through stage 4 chronic kidney disease, or unspecified chronic kidney disease: Secondary | ICD-10-CM | POA: Diagnosis not present

## 2023-04-29 DIAGNOSIS — I493 Ventricular premature depolarization: Secondary | ICD-10-CM | POA: Diagnosis not present

## 2023-04-29 DIAGNOSIS — I428 Other cardiomyopathies: Secondary | ICD-10-CM | POA: Diagnosis not present

## 2023-04-29 DIAGNOSIS — I10 Essential (primary) hypertension: Secondary | ICD-10-CM

## 2023-04-29 DIAGNOSIS — I5022 Chronic systolic (congestive) heart failure: Secondary | ICD-10-CM | POA: Insufficient documentation

## 2023-04-29 DIAGNOSIS — E785 Hyperlipidemia, unspecified: Secondary | ICD-10-CM | POA: Insufficient documentation

## 2023-04-29 DIAGNOSIS — R0683 Snoring: Secondary | ICD-10-CM | POA: Diagnosis not present

## 2023-04-29 DIAGNOSIS — N1831 Chronic kidney disease, stage 3a: Secondary | ICD-10-CM | POA: Diagnosis not present

## 2023-04-29 LAB — BASIC METABOLIC PANEL
Anion gap: 5 (ref 5–15)
BUN: 16 mg/dL (ref 8–23)
CO2: 26 mmol/L (ref 22–32)
Calcium: 9.1 mg/dL (ref 8.9–10.3)
Chloride: 106 mmol/L (ref 98–111)
Creatinine, Ser: 1.66 mg/dL — ABNORMAL HIGH (ref 0.61–1.24)
GFR, Estimated: 46 mL/min — ABNORMAL LOW (ref >60)
Glucose, Bld: 84 mg/dL (ref 70–99)
Potassium: 4.1 mmol/L (ref 3.5–5.1)
Sodium: 137 mmol/L (ref 135–145)

## 2023-04-29 LAB — CBC
HCT: 50.8 % (ref 39.0–52.0)
Hemoglobin: 16.5 g/dL (ref 13.0–17.0)
MCH: 28 pg (ref 26.0–34.0)
MCHC: 32.5 g/dL (ref 30.0–36.0)
MCV: 86.2 fL (ref 80.0–100.0)
Platelets: 189 10*3/uL (ref 150–400)
RBC: 5.89 MIL/uL — ABNORMAL HIGH (ref 4.22–5.81)
RDW: 13.4 % (ref 11.5–15.5)
WBC: 8.8 10*3/uL (ref 4.0–10.5)
nRBC: 0 % (ref 0.0–0.2)

## 2023-04-29 LAB — BRAIN NATRIURETIC PEPTIDE: B Natriuretic Peptide: 22.8 pg/mL (ref 0.0–100.0)

## 2023-04-29 MED ORDER — CARVEDILOL 3.125 MG PO TABS: 6.2500 mg | ORAL_TABLET | Freq: Two times a day (BID) | ORAL | 1 refills | Status: DC

## 2023-04-29 MED ORDER — CARVEDILOL 6.25 MG PO TABS: 6.2500 mg | ORAL_TABLET | Freq: Two times a day (BID) | ORAL | 3 refills | Status: DC

## 2023-04-29 NOTE — Progress Notes (Signed)
ADVANCED HF CLINIC NOTE   Primary Care: Pcp, No HF Cardiologist: Dr. Gala Romney  HPI: Adam Henson is a 62 y.o. 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  He was found to be in atrial fibrillation with RVR rates in 160s-170s. Diuresed with IV lasix and started on Dilt gtt, later switched to metoprolol for rate control. Planned for TEE/DCCV, TEE EF < 20%, moderate MR, large LAA thrombus; no DCCV. Required amiodarone gtt and DBA for BP support. RHC/LHC showed normal coronaries, elevated filling pressures with normal cardiac output, an EF < 25%. Suspected tachy-medicated. 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 Drips weaned off, GDMT titrated. Discharged home, weight 198 lbs.  Echo 03/26/22 EF 55%  Follow up with EP 1/24 and planning for ablation 4/24.  Last seen 2/24, doing well with NYHA I symptoms.  S/p AF ablation 4/24, now off amiodarone.  Today he returns for HF follow up. Overall feeling fine. He continues to work out, no SOB with this. He has occasional positional dizziness. Feels rare palpitations. Denies abnormal bleeding, CP,  edema, or PND/Orthopnea. Appetite ok. No fever or chills. Weight at home 236 pounds. Taking all medications, has not needed any Lasix.   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 Moderate MR  - R/LHC (6/23): EF 25%, normal cors   Ao = 95/68 (81) LV = 91/23 RA =  8 RV = 51/10  PA = 51/23 (36) PCW = 26 (v = 45) Fick cardiac output/index = 5.7/2.6 PVR = 1.9 WU Ao sat = 93% PA sat = 67%, 67%   Assessment: 1. Normal coronary arteries  2. Severe NICM EF < 25% 3. Elevated filling pressures with normal cardiac output  4. Prominent v-waves in PCWP tracing suggestive of MR   - Echo (6/23): EF 25-30%,  severe LV dysfunction with global HK, RV moderately reduced, mild to moderate MR  Past Medical History:  Diagnosis Date   Hyperlipidemia    Current Outpatient Medications  Medication Sig Dispense Refill   Brimonidine Tartrate (LUMIFY) 0.025 % SOLN Place 1 drop into both eyes daily as needed (redness).     carvedilol (COREG) 3.125 MG tablet TAKE 1 TABLET BY MOUTH TWICE A DAY 180 tablet 1   ELIQUIS 5 MG TABS tablet TAKE 1 TABLET BY MOUTH TWICE A DAY 180 tablet 3   FARXIGA 10 MG TABS tablet TAKE 1 TABLET BY MOUTH DAILY BEFORE BREAKFAST. 90 tablet 2   furosemide (LASIX) 40 MG tablet TAKE 1 TABLET (40 MG TOTAL) BY MOUTH DAILY AS NEEDED. TAKE AS DIRECTED AT DISCHARGE 90 tablet 3   sacubitril-valsartan (ENTRESTO) 97-103 MG Take 1 tablet by mouth 2 (two) times daily. NEEDS FOLLOW UP APPOINTMENT FOR ANYMORE REFILLS 180 tablet 1   No current facility-administered medications for this encounter.   No Known Allergies  Social History   Socioeconomic History   Marital status: Single    Spouse name: Not on file   Number of children: Not on file   Years of education: Not on file   Highest education level: Not on file  Occupational History   Not on file  Tobacco Use   Smoking status: Former    Current packs/day: 0.00    Types: Cigarettes  Quit date: 09/24/1987    Years since quitting: 35.6   Smokeless tobacco: Never   Tobacco comments:    Smoked from age 63 until age 70  Substance and Sexual Activity   Alcohol use: No   Drug use: No   Sexual activity: Yes    Partners: Male  Other Topics Concern   Not on file  Social History Narrative   Not on file   Social Determinants of Health   Financial Resource Strain: Not on file  Food Insecurity: Not on file  Transportation Needs: Not on file  Physical Activity: Not on file  Stress: Not on file  Social Connections: Not on file  Intimate Partner Violence: Not on file   Family History  Problem Relation Age of Onset   Hyperlipidemia  Mother    Breast cancer Mother    Cancer Mother    Hyperlipidemia Father    Lung cancer Father        deceased   Cancer Father    Wt 112.4 kg (247 lb 12.8 oz)   BMI 34.56 kg/m   Wt Readings from Last 3 Encounters:  04/29/23 112.4 kg (247 lb 12.8 oz)  01/15/23 106.9 kg (235 lb 9.6 oz)  10/27/22 107 kg (236 lb)   PHYSICAL EXAM: General:  NAD. No resp difficulty, walked into clinic, appears younger than stated age HEENT: Normal Neck: Supple. No JVD. Carotids 2+ bilat; no bruits. No lymphadenopathy or thryomegaly appreciated. Cor: PMI nondisplaced. Regular rate & rhythm. No rubs, gallops or murmurs. Lungs: Clear Abdomen: Soft, nontender, nondistended. No hepatosplenomegaly. No bruits or masses. Good bowel sounds. Extremities: No cyanosis, clubbing, rash, edema Neuro: Alert & oriented x 3, cranial nerves grossly intact. Moves all 4 extremities w/o difficulty. Affect pleasant.  ECG (personally reviewed): NSR 75 bpm  ASSESSMENT & PLAN:  1. 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 - New diagnosis. Suspect tachy-mediated, admitted with AF with RVR.  - R/LHC (6/23): --> normal coronaries, elevated filling pressures with normal cardiac output, an EF < 25%. Suspected tachy-medicated.  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 Moderate MR - Echo 03/26/22 EF 55% (EF recovered with maintenance of SR) - NYHA I, volume looks good today off lasix - Increase Coreg to 6.25 mg bid. - Continue Entresto 97/103 bid - Continue Farxiga 10 mg daily. - Continue Lasix 40 mg PRN. - Off spiro with elevated K. - Labs today. - Update echo   2. 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 - Chemically converted with amio.  - s/p AF ablation 4/24, now off amio - NSR  on ECG today - Continue Eliquis. No bleeding. - Needs to complete home sleep study. Discussed this today - CBC today.   3. CKD 3a - Baseline Scr 1.6. - Continue SGLT2i - Labs today.   4. PVCs, rare - None on ECG today. - Continue Coreg. - Needs to complete sleep study  5. Snoring - Sleep study as above.  6. HTN - BP up a bit - Increase Coreg as above  Follow up in 4 months with Dr. Gala Romney + echo  Jacklynn Ganong, FNP  3:15 PM

## 2023-04-29 NOTE — Patient Instructions (Signed)
Thank you for coming in today  If you had labs drawn today, any labs that are abnormal the clinic will call you No news is good news  Medications: Increase Coreg to 6.25 mg 1 tablet twice daily   Please complete home sleep study  Follow up appointments:  Your physician recommends that you schedule a follow-up appointment in:  4 months With Dr. Gala Romney echocardiogram You will receive a reminder letter in the mail a few months in advance. If you don't receive a letter, please call our office to schedule the follow-up appointment.  Your physician has requested that you have an echocardiogram. Echocardiography is a painless test that uses sound waves to create images of your heart. It provides your doctor with information about the size and shape of your heart and how well your heart's chambers and valves are working. This procedure takes approximately one hour. There are no restrictions for this procedure.      Do the following things EVERYDAY: Weigh yourself in the morning before breakfast. Write it down and keep it in a log. Take your medicines as prescribed Eat low salt foods--Limit salt (sodium) to 2000 mg per day.  Stay as active as you can everyday Limit all fluids for the day to less than 2 liters   At the [facility], you and your health needs are our priority. As part of our continuing mission to provide you with exceptional heart care, we have created designated Provider Care Teams. These Care Teams include your primary Cardiologist (physician) and Advanced Practice Providers (APPs- Physician Assistants and Nurse Practitioners) who all work together to provide you with the care you need, when you need it.   You may see any of the following providers on your designated Care Team at your next follow up: Dr Arvilla Meres Dr Marca Ancona Dr. Marcos Eke, NP Robbie Lis, [place] [facility], [place] Brynda Peon, NP Karle Plumber, PharmD   Please be sure to bring in all your medications bottles to every appointment.    Thank you for choosing [facility]  If you have any questions or concerns before your next appointment please send [place] a message through [place] or call our office at [phone].    TO LEAVE A MESSAGE FOR THE NURSE SELECT OPTION 2, PLEASE LEAVE A MESSAGE INCLUDING: YOUR NAME DATE OF BIRTH CALL BACK NUMBER REASON FOR CALL**this is important as we prioritize the call backs  YOU WILL RECEIVE A CALL BACK THE SAME DAY AS LONG AS YOU CALL BEFORE 4:00 PM

## 2023-08-03 ENCOUNTER — Telehealth (HOSPITAL_COMMUNITY): Payer: Self-pay

## 2023-08-03 NOTE — Telephone Encounter (Signed)
Received a fax requesting medical records from [facility]. Records were successfully faxed to: [phone] ,which was the number provided.. Medical request form will be scanned into patients chart.

## 2023-11-13 ENCOUNTER — Other Ambulatory Visit (HOSPITAL_COMMUNITY): Payer: Self-pay | Admitting: Internal Medicine

## 2024-01-18 ENCOUNTER — Telehealth (HOSPITAL_COMMUNITY): Payer: Self-pay

## 2024-01-18 ENCOUNTER — Telehealth (HOSPITAL_COMMUNITY): Payer: Self-pay | Admitting: *Deleted

## 2024-01-18 ENCOUNTER — Other Ambulatory Visit (HOSPITAL_COMMUNITY): Payer: Self-pay

## 2024-01-18 NOTE — Telephone Encounter (Signed)
 Advanced Heart Failure Patient Advocate Encounter  Prior authorization for Entresto  97/103 MG has been submitted and approved. Test billing returns $4 for 90 day supply.  Key: [id number] Effective: 01/18/2024 to 01/17/2025  Rachel DEL, CPhT Rx Patient Advocate Phone: [phone]

## 2024-01-18 NOTE — Telephone Encounter (Signed)
 Pt states he dropped Medicaid and no only has Physiological scientist and per his pharmacy he needs non formulary approval for the Entresto  and Farxiga , reports he is out of both medications at this time. Advised will send info to pharmacy team to work on, if not approved today we can get him samples.

## 2024-01-18 NOTE — Telephone Encounter (Signed)
 Advanced Heart Failure Patient Advocate Encounter  Patient contacted office stating prior auth needed for Entresto  and Farxiga .  Entresto  prior auth submitted in alternate encounter, confirmed $4 copay with pharmacy. [facility] stated patient received a 90 day supply of Farxiga  10 mg on 12/21/2023 and there is currently a refill too soon rejection. Test billing for 5 mg shows a successful claim, with $4 copay. No indication that a prior authorization is needed for Farxiga  at this time. Left voicemail for patient.  Rachel DEL, CPhT Rx Patient Advocate Phone: [phone]

## 2024-01-18 NOTE — Telephone Encounter (Signed)
 Pt called back is aware of entresto  cost

## 2024-01-19 ENCOUNTER — Other Ambulatory Visit (HOSPITAL_COMMUNITY): Payer: Self-pay

## 2024-04-14 ENCOUNTER — Other Ambulatory Visit (HOSPITAL_COMMUNITY): Payer: Self-pay | Admitting: Internal Medicine

## 2024-04-20 ENCOUNTER — Other Ambulatory Visit (HOSPITAL_COMMUNITY): Payer: Self-pay | Admitting: Family Medicine

## 2024-04-21 ENCOUNTER — Other Ambulatory Visit (HOSPITAL_COMMUNITY): Payer: Self-pay | Admitting: Internal Medicine

## 2024-05-15 ENCOUNTER — Other Ambulatory Visit (HOSPITAL_COMMUNITY): Payer: Self-pay | Admitting: Family Medicine

## 2024-05-16 ENCOUNTER — Telehealth (HOSPITAL_COMMUNITY): Payer: Self-pay

## 2024-05-16 NOTE — Telephone Encounter (Signed)
 CALLED PATIENT LEFT VOICEMAIL TO CALLBACK AND SCHEDULE APPOINTMENT PATIENT IS OVERDUE

## 2024-05-17 NOTE — Telephone Encounter (Signed)
 Scheduled 12/10 @ 2

## 2024-05-18 ENCOUNTER — Other Ambulatory Visit (HOSPITAL_COMMUNITY): Payer: Self-pay | Admitting: Internal Medicine

## 2024-05-24 ENCOUNTER — Telehealth (HOSPITAL_COMMUNITY): Payer: Self-pay

## 2024-05-24 NOTE — Telephone Encounter (Signed)
 Called to confirm/remind patient of their appointment at the [facility] on 05/25/24 2:00.   Appointment:   [x] Confirmed  [] Left mess   [] No answer/No voice mail  [] VM Full/unable to leave message  [] Phone not in service  Patient reminded to bring all medications and/or complete list.  Confirmed patient has transportation. Gave directions, instructed to utilize valet parking.

## 2024-05-24 NOTE — Progress Notes (Signed)
 ADVANCED HF CLINIC NOTE   Primary Care: Pcp, No HF Cardiologist: Dr. Cherrie  HPI: Adam Henson is a 63 y.o. 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  He was found to be in atrial fibrillation with RVR rates in 160s-170s. Diuresed with IV lasix  and started on Dilt gtt, later switched to metoprolol  for rate control. Planned for TEE/DCCV, TEE EF < 20%, moderate MR, large LAA thrombus; no DCCV. Required amiodarone  gtt and DBA for BP support. RHC/LHC showed normal coronaries, elevated filling pressures with normal cardiac output, an EF < 25%. Suspected tachy-medicated. 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 Drips weaned off, GDMT titrated. Discharged home, weight 198 lbs.  Echo 03/26/22 EF 55%  Follow up with EP 1/24 and planning for ablation 4/24.  Last seen 2/24, doing well with NYHA I symptoms.  S/p AF ablation 4/24, now off amiodarone .  Today he returns for AHF follow up. Overall feeling ***. Denies palpitations, CP, dizziness, edema, or PND/Orthopnea. *** SOB. Appetite ok. Weight at home *** pounds. Taking all medications. Denies ETOH, tobacco or drug use.   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 Moderate MR  - R/LHC (6/23): EF 25%, normal cors   Ao = 95/68 (81) LV = 91/23 RA =  8 RV = 51/10  PA = 51/23 (36) PCW = 26 (v = 45) Fick cardiac output/index = 5.7/2.6 PVR = 1.9 WU Ao sat = 93% PA sat = 67%, 67%   Assessment: 1. Normal coronary arteries  2. Severe NICM EF < 25% 3. Elevated filling pressures with normal cardiac output  4. Prominent v-waves in PCWP tracing suggestive of MR   - Echo (6/23): EF 25-30%, severe LV dysfunction with global HK, RV moderately reduced, mild to moderate MR  Past Medical History:   Diagnosis Date   Hyperlipidemia    Current Outpatient Medications  Medication Sig Dispense Refill   apixaban  (ELIQUIS ) 5 MG TABS tablet Take 1 tablet (5 mg total) by mouth 2 (two) times daily. 60 tablet 3   Brimonidine Tartrate (LUMIFY) 0.025 % SOLN Place 1 drop into both eyes daily as needed (redness).     carvedilol  (COREG ) 3.125 MG tablet Take 2 tablets (6.25 mg total) by mouth 2 (two) times daily. 180 tablet 1   carvedilol  (COREG ) 6.25 MG tablet Take 1 tablet (6.25 mg total) by mouth 2 (two) times daily. PLEASE SCHEDULE APPOINTMENT FOR MORE REFILLS [id number] OPTION 2 180 tablet 1   FARXIGA  10 MG TABS tablet TAKE 1 TABLET BY MOUTH DAILY BEFORE BREAKFAST. 90 tablet 2   furosemide  (LASIX ) 40 MG tablet TAKE 1 TABLET (40 MG TOTAL) BY MOUTH DAILY AS NEEDED. TAKE AS DIRECTED AT DISCHARGE 90 tablet 3   sacubitril -valsartan  (ENTRESTO ) 97-103 MG Take 1 tablet by mouth 2 (two) times daily. PLEASE SCHEDULE APPOINTMENT FOR MORE REFILLS 180 tablet 1   No current facility-administered medications for this visit.   No Known Allergies  Social History   Socioeconomic History   Marital status: Single    Spouse name: Not on file   Number of children: Not on file   Years of education: Not on file   Highest education level: Not on file  Occupational History   Not on file  Tobacco Use   Smoking status: Former    Current packs/day: 0.00    Types: Cigarettes    Quit date: 09/24/1987    Years since quitting: 36.6   Smokeless tobacco: Never   Tobacco comments:    Smoked from age 62 until age 71  Substance and Sexual Activity   Alcohol use: No   Drug use: No   Sexual activity: Yes    Partners: Male  Other Topics Concern   Not on file  Social History Narrative   Not on file   Social Drivers of Health   Financial Resource Strain: Not on file  Food Insecurity: Not on file  Transportation Needs: Not on file  Physical Activity: Not on file  Stress: Not on file  Social Connections: Not on  file  Intimate Partner Violence: Not on file   Family History  Problem Relation Age of Onset   Hyperlipidemia Mother    Breast cancer Mother    Cancer Mother    Hyperlipidemia Father    Lung cancer Father        deceased   Cancer Father    There were no vitals taken for this visit.  Wt Readings from Last 3 Encounters:  04/29/23 112.4 kg (247 lb 12.8 oz)  01/15/23 106.9 kg (235 lb 9.6 oz)  10/27/22 107 kg (236 lb)   PHYSICAL EXAM: General:  *** appearing.  No respiratory difficulty Neck: JVD *** cm.  Cor: Regular rate & rhythm. No murmurs. Lungs: clear Extremities: no edema  Neuro: alert & oriented x 3. Affect pleasant.   ECG (personally reviewed): ***  ASSESSMENT & PLAN:  1. 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 - New diagnosis. Suspect tachy-mediated, admitted with AF with RVR.  - R/LHC (6/23): --> normal coronaries, elevated filling pressures with normal cardiac output, an EF < 25%. Suspected tachy-medicated.  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 Moderate MR - Echo 03/26/22 EF 55% (EF recovered with maintenance of SR) - NYHA I, volume looks good today off lasix  *** - Increase Coreg  to 6.25 mg bid. - Continue Entresto  97/103 bid - Continue Farxiga  10 mg daily. - Continue Lasix  40 mg PRN. - Off spiro with elevated K. - Labs today. - Update echo ***   2. 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 - Chemically converted with amio.  - s/p AF ablation 4/24, now off amio - NSR on ECG today - Continue Eliquis . No bleeding. - Needs to complete home sleep study. Discussed this today - CBC today.   3. CKD 3a - Baseline Scr 1.6. - Continue SGLT2i - Labs today.   4. PVCs, rare - None on ECG today. *** - Continue  Coreg . - Needs to complete sleep study  5. Snoring - Sleep study as above.  6. HTN - BP up a bit - Increase Coreg  as above  Follow up in 4 months with Dr. Cherrie + echo ***  Beckey LITTIE Coe, NP  9:04 PM

## 2024-05-25 ENCOUNTER — Ambulatory Visit (HOSPITAL_COMMUNITY)
Admission: RE | Admit: 2024-05-25 | Discharge: 2024-05-25 | Disposition: A | Discharge status: Home / Self Care | Source: Ambulatory Visit | Attending: Internal Medicine | Admitting: Internal Medicine

## 2024-05-25 ENCOUNTER — Encounter (HOSPITAL_COMMUNITY): Payer: Self-pay

## 2024-05-25 ENCOUNTER — Ambulatory Visit (HOSPITAL_COMMUNITY): Payer: Self-pay | Admitting: Internal Medicine

## 2024-05-25 VITALS — BP 130/80 | HR 72 | Ht 71.0 in | Wt 235.6 lb

## 2024-05-25 DIAGNOSIS — I5022 Chronic systolic (congestive) heart failure: Secondary | ICD-10-CM | POA: Diagnosis not present

## 2024-05-25 DIAGNOSIS — I340 Nonrheumatic mitral (valve) insufficiency: Secondary | ICD-10-CM | POA: Insufficient documentation

## 2024-05-25 DIAGNOSIS — Q2112 Patent foramen ovale: Secondary | ICD-10-CM | POA: Insufficient documentation

## 2024-05-25 DIAGNOSIS — I493 Ventricular premature depolarization: Secondary | ICD-10-CM

## 2024-05-25 DIAGNOSIS — I10 Essential (primary) hypertension: Secondary | ICD-10-CM

## 2024-05-25 DIAGNOSIS — I4891 Unspecified atrial fibrillation: Secondary | ICD-10-CM | POA: Diagnosis not present

## 2024-05-25 DIAGNOSIS — Z7984 Long term (current) use of oral hypoglycemic drugs: Secondary | ICD-10-CM | POA: Insufficient documentation

## 2024-05-25 DIAGNOSIS — I130 Hypertensive heart and chronic kidney disease with heart failure and stage 1 through stage 4 chronic kidney disease, or unspecified chronic kidney disease: Secondary | ICD-10-CM | POA: Insufficient documentation

## 2024-05-25 DIAGNOSIS — Z87891 Personal history of nicotine dependence: Secondary | ICD-10-CM | POA: Insufficient documentation

## 2024-05-25 DIAGNOSIS — I480 Paroxysmal atrial fibrillation: Secondary | ICD-10-CM | POA: Insufficient documentation

## 2024-05-25 DIAGNOSIS — Z79899 Other long term (current) drug therapy: Secondary | ICD-10-CM | POA: Insufficient documentation

## 2024-05-25 DIAGNOSIS — N1831 Chronic kidney disease, stage 3a: Secondary | ICD-10-CM | POA: Insufficient documentation

## 2024-05-25 DIAGNOSIS — Z7901 Long term (current) use of anticoagulants: Secondary | ICD-10-CM | POA: Insufficient documentation

## 2024-05-25 DIAGNOSIS — I428 Other cardiomyopathies: Secondary | ICD-10-CM | POA: Insufficient documentation

## 2024-05-25 LAB — BASIC METABOLIC PANEL WITH GFR
Anion gap: 9 (ref 5–15)
BUN: 19 mg/dL (ref 8–23)
CO2: 27 mmol/L (ref 22–32)
Calcium: 9.3 mg/dL (ref 8.9–10.3)
Chloride: 102 mmol/L (ref 98–111)
Creatinine, Ser: 1.59 mg/dL — ABNORMAL HIGH (ref 0.61–1.24)
GFR, Estimated: 48 mL/min — ABNORMAL LOW (ref >60)
Glucose, Bld: 90 mg/dL (ref 70–99)
Potassium: 4.6 mmol/L (ref 3.5–5.1)
Sodium: 138 mmol/L (ref 135–145)

## 2024-05-25 LAB — CBC
HCT: 50.2 % (ref 39.0–52.0)
Hemoglobin: 16.8 g/dL (ref 13.0–17.0)
MCH: 28.8 pg (ref 26.0–34.0)
MCHC: 33.5 g/dL (ref 30.0–36.0)
MCV: 86.1 fL (ref 80.0–100.0)
Platelets: 208 K/uL (ref 150–400)
RBC: 5.83 MIL/uL — ABNORMAL HIGH (ref 4.22–5.81)
RDW: 12.8 % (ref 11.5–15.5)
WBC: 8.1 K/uL (ref 4.0–10.5)
nRBC: 0 % (ref 0.0–0.2)

## 2024-05-25 LAB — BRAIN NATRIURETIC PEPTIDE: B Natriuretic Peptide: 27.4 pg/mL (ref 0.0–100.0)

## 2024-05-25 MED ORDER — CARVEDILOL 6.25 MG PO TABS: 9.3750 mg | ORAL_TABLET | Freq: Two times a day (BID) | ORAL | 5 refills | Status: AC

## 2024-05-25 NOTE — Telephone Encounter (Signed)
 Error

## 2024-05-25 NOTE — Patient Instructions (Signed)
 Medication Changes:  INCREASE CARVEDILOL  TO 9.375 MG (1.5 TABLET) TWICE DAILY   Lab Work:  Labs done today, your results will be available in MyChart, we will contact you for abnormal readings.  Testing/Procedures:  ECHOCARDIOGRAM AS SCHEDULED   Follow-Up in: 3 MONTHS PLEASE CALL OUR OFFICE AROUND JANUARY TO GET SCHEDULED FOR YOUR APPOINTMENT. PHONE NUMBER IS [phone] OPTION 2   At the [facility], you and your health needs are our priority. We have a designated team specialized in the treatment of Heart Failure. This Care Team includes your primary Heart Failure Specialized Cardiologist (physician), Advanced Practice Providers (APPs- Physician Assistants and Nurse Practitioners), and Pharmacist who all work together to provide you with the care you need, when you need it.   You may see any of the following providers on your designated Care Team at your next follow up:  Dr. Toribio Fuel Dr. Ezra Shuck Dr. Odis Brownie Greig Mosses, NP Caffie Shed, [place] [facility] [place], [place] Beckey Coe, NP Jordan Lee, NP Tinnie Redman, PharmD   Please be sure to bring in all your medications bottles to every appointment.   Need to Contact Us :  If you have any questions or concerns before your next appointment please send us  a message through [place] or call our office at [phone].    TO LEAVE A MESSAGE FOR THE NURSE SELECT OPTION 2, PLEASE LEAVE A MESSAGE INCLUDING: YOUR NAME DATE OF BIRTH CALL BACK NUMBER REASON FOR CALL**this is important as we prioritize the call backs  YOU WILL RECEIVE A CALL BACK THE SAME DAY AS LONG AS YOU CALL BEFORE 4:00 PM

## 2024-06-23 ENCOUNTER — Ambulatory Visit (HOSPITAL_COMMUNITY)

## 2024-07-05 ENCOUNTER — Telehealth (HOSPITAL_COMMUNITY): Payer: Self-pay

## 2024-07-05 ENCOUNTER — Other Ambulatory Visit (HOSPITAL_COMMUNITY): Payer: Self-pay

## 2024-07-05 NOTE — Telephone Encounter (Signed)
 Advanced Heart Failure Patient Advocate Encounter  Prior authorization for Farxiga  has been submitted and approved. Test billing returns $4 for 90 day supply.  Key: [id number] Effective: 07/05/2024 to 07/05/2025  Rachel DEL, CPhT Rx Patient Advocate Phone: [phone]

## 2024-07-06 ENCOUNTER — Ambulatory Visit (HOSPITAL_COMMUNITY)
Admission: RE | Admit: 2024-07-06 | Discharge: 2024-07-06 | Disposition: A | Discharge status: Home / Self Care | Source: Ambulatory Visit | Attending: Internal Medicine | Admitting: Internal Medicine

## 2024-07-06 DIAGNOSIS — I340 Nonrheumatic mitral (valve) insufficiency: Secondary | ICD-10-CM | POA: Diagnosis not present

## 2024-07-06 DIAGNOSIS — I5022 Chronic systolic (congestive) heart failure: Secondary | ICD-10-CM | POA: Insufficient documentation

## 2024-07-06 LAB — ECHOCARDIOGRAM COMPLETE
AR max vel: 2.86 cm2
AV Area VTI: 2.85 cm2
AV Area mean vel: 2.78 cm2
AV Mean grad: 3 mmHg
AV Peak grad: 5 mmHg
Ao pk vel: 1.12 m/s
Area-P 1/2: 4.68 cm2
Calc EF: 56.3 %
S' Lateral: 3.5 cm
Single Plane A2C EF: 58.6 %
Single Plane A4C EF: 56.4 %

## 2024-07-06 MED ORDER — DAPAGLIFLOZIN PROPANEDIOL 10 MG PO TABS: 10.0000 mg | ORAL_TABLET | Freq: Every day | ORAL | 2 refills | Status: AC

## 2024-07-06 NOTE — Addendum Note (Signed)
 Addended by: JERONA DALTON HERO on: 07/06/2024 03:35 PM   Modules accepted: Orders
